# Patient Record
Sex: Female | Born: 1980 | Race: Black or African American | Marital: Single | Smoking: Current every day smoker
Health system: Southern US, Community
[De-identification: ages and names within clinical notes are randomized; demographics above are authoritative.]

## PROBLEM LIST (undated history)

## (undated) DIAGNOSIS — F419 Anxiety disorder, unspecified: Secondary | ICD-10-CM

## (undated) HISTORY — PX: TUBAL LIGATION: SHX77

---

## 2012-09-09 ENCOUNTER — Encounter (HOSPITAL_COMMUNITY): Payer: Self-pay | Admitting: *Deleted

## 2012-09-09 ENCOUNTER — Emergency Department (HOSPITAL_COMMUNITY)
Admission: EM | Admit: 2012-09-09 | Discharge: 2012-09-09 | Disposition: A | Discharge status: Home / Self Care | Payer: Medicaid Other | Attending: Emergency Medicine | Admitting: Emergency Medicine

## 2012-09-09 ENCOUNTER — Emergency Department (HOSPITAL_COMMUNITY): Payer: Medicaid Other

## 2012-09-09 DIAGNOSIS — F17200 Nicotine dependence, unspecified, uncomplicated: Secondary | ICD-10-CM | POA: Insufficient documentation

## 2012-09-09 DIAGNOSIS — R209 Unspecified disturbances of skin sensation: Secondary | ICD-10-CM | POA: Insufficient documentation

## 2012-09-09 DIAGNOSIS — R079 Chest pain, unspecified: Secondary | ICD-10-CM | POA: Insufficient documentation

## 2012-09-09 DIAGNOSIS — M5412 Radiculopathy, cervical region: Secondary | ICD-10-CM | POA: Insufficient documentation

## 2012-09-09 DIAGNOSIS — M79601 Pain in right arm: Secondary | ICD-10-CM

## 2012-09-09 DIAGNOSIS — M79609 Pain in unspecified limb: Secondary | ICD-10-CM | POA: Insufficient documentation

## 2012-09-09 MED ORDER — HYDROCODONE-ACETAMINOPHEN 5-325 MG PO TABS
1.0000 | ORAL_TABLET | Freq: Four times a day (QID) | ORAL | Status: DC | PRN
Start: 2012-09-09 — End: 2013-08-19

## 2012-09-09 MED ORDER — METHYLPREDNISOLONE 4 MG PO KIT: PACK | ORAL | Status: DC

## 2012-09-09 MED ORDER — HYDROMORPHONE HCL PF 1 MG/ML IJ SOLN
1.0000 mg | Freq: Once | INTRAMUSCULAR | Status: AC
  Administered 2012-09-09: 1 mg via INTRAMUSCULAR
  Filled 2012-09-09: qty 1

## 2012-09-09 MED ORDER — IBUPROFEN 600 MG PO TABS: 600.0000 mg | ORAL_TABLET | Freq: Four times a day (QID) | ORAL | Status: DC | PRN

## 2012-09-09 NOTE — ED Notes (Signed)
Pt reports right arm pain and numbness since Friday morning and pain with movement and chest pain

## 2012-09-09 NOTE — ED Provider Notes (Signed)
History     CSN: [id number]  Arrival date & time 09/09/12  1718   First MD Initiated Contact with Patient 09/09/12 2120      Chief Complaint  Patient presents with  . Arm Pain    right  . Chest Pain    (Consider location/radiation/quality/duration/timing/severity/associated sxs/prior treatment) HPI Comments: 32 y Holly [name] with R arm pain since Saturday AM.  She reports that she woke up with the pain and that it has gotten worse since onset.  She describes it as a shooting pain, beginning in her neck and radiating down her arm.  She endorses paresthesias, as well.  No falls, HA, vision changes, slurred speech, CP, SOB.  No trauma, MVC, falls, roller coasters, etc.    Patient is a 32 y.o. female presenting with extremity pain. The history is provided by the patient.  Extremity Pain This is a new problem. Episode onset: saturday morning. The problem occurs constantly. The problem has been gradually worsening. Pertinent negatives include no abdominal pain, change in bowel habit, chest pain, coughing, fever, headaches, nausea, urinary symptoms, visual change or weakness. She has tried acetaminophen for the symptoms. The treatment provided mild relief.    History reviewed. No pertinent past medical history.  History reviewed. No pertinent past surgical history.  No family history on file.  History  Substance Use Topics  . Smoking status: Current Every Day Smoker  . Smokeless tobacco: Not on file  . Alcohol Use: No    OB History   Grav Para Term Preterm Abortions TAB SAB Ect Mult Living                  Review of Systems  Constitutional: Negative for fever.  Respiratory: Negative for cough.   Cardiovascular: Negative for chest pain.  Gastrointestinal: Negative for nausea, abdominal pain and change in bowel habit.  Neurological: Negative for weakness and headaches.  All other systems reviewed and are negative.    Allergies  Review of patient's allergies indicates no known  allergies.  Home Medications   Current Outpatient Rx  Name  Route  Sig  Dispense  Refill  . ibuprofen (ADVIL,MOTRIN) 200 MG tablet   Oral   Take 200 mg by mouth every 6 (six) hours as needed for pain.         Marland Kitchen HYDROcodone-acetaminophen (NORCO) 5-325 MG per tablet   Oral   Take 1 tablet by mouth every 6 (six) hours as needed for pain.   12 tablet   0   . ibuprofen (ADVIL,MOTRIN) 600 MG tablet   Oral   Take 1 tablet (600 mg total) by mouth every 6 (six) hours as needed for pain.   30 tablet   0   . methylPREDNISolone (MEDROL DOSEPAK) 4 MG tablet      Take 24 mg (6 tabs) on day 1, then 20 mg (5 tabs) on day 2, then 16 mg (4 tabs) on day 3, then 12 mg (3 tabs) on day 4, then 8 mg (2 tabs) on day 5 and then 4 mg (1 tab) on day 6   21 tablet   0     BP 135/94  Pulse Holly  Temp(Src) 99 Holly [name] (37.2 C) (Oral)  Resp 16  SpO2 100%  Physical Exam  Vitals reviewed. Constitutional: She is oriented to person, place, and time. She appears well-developed and well-nourished.  HENT:  Right Ear: External ear normal.  Left Ear: External ear normal.  Mouth/Throat: No oropharyngeal exudate.  Eyes: Conjunctivae  and EOM are normal. Pupils are equal, round, and reactive to light.  Neck: Normal range of motion. Neck supple.  Cardiovascular: Normal rate, regular rhythm, normal heart sounds and intact distal pulses.  Exam reveals no gallop and no friction rub.   No murmur heard. Pulmonary/Chest: Effort normal and breath sounds normal.  Abdominal: Soft. Bowel sounds are normal. She exhibits no distension. There is no tenderness.  Musculoskeletal: She exhibits no edema.       Right shoulder: She exhibits decreased range of motion (2/2 pain). She exhibits no pain, no spasm and normal strength.       Right elbow: She exhibits decreased range of motion (2/2 pain). She exhibits no swelling and no effusion.       Right wrist: Normal.       Back:       Right hand: Normal. Normal sensation noted.  Decreased sensation is not present in the ulnar distribution, is not present in the medial distribution and is not present in the radial distribution. Normal strength noted.  R arm exam: Pain with all movements 5/5 abduction/ext rotation of R shoudler 5/5 flex/ext of R elbow 5/5 R wrist flex/ext 5/5 grip Sensation intact to light touch in median, axillary, radial and ulnar distributions 2+ reflexes, 2+ radial  Neurological: She is alert and oriented to person, place, and time. No cranial nerve deficit.  Skin: Skin is warm and dry. No rash noted.  Psychiatric: She has a normal mood and affect.    ED Course  Procedures (including critical care time)  Labs Reviewed - No data to display Dg Cervical Spine Complete  09/09/2012  *RADIOLOGY REPORT*  Clinical Data: Right arm pain and numbness for 3 days without trauma  CERVICAL SPINE - COMPLETE 4+ VIEW  Comparison: None.  Findings: The lateral view images through the bottom of C7. Prevertebral soft tissues are within normal limits.  Maintenance of vertebral body height.  Mild straightening expected cervical lordosis.  Possible mild left greater than right neural foraminal narrowing secondary uncovertebral joint hypertrophy at C5-C6. Lateral masses and imaged portion of odontoid process intact.  IMPRESSION: Mild nonspecific straightening of expected cervical lordosis. Possible mild left greater than right neural foraminal narrowing at C5-C6 secondary uncovertebral joint hypertrophy.   Original Report Authenticated By: Jeronimo Greaves, M.D.      1. Cervical radiculopathy   2. Right arm pain       MDM   32 y Holly [name] with R arm pain since Saturday AM.  She reports that she woke up with the pain and that it has gotten worse since onset.  She describes it as a shooting pain, beginning in her neck and radiating down her arm.  She endorses paresthesias, as well.  No falls, HA, vision changes, slurred speech, CP, SOB.  No trauma, MVC, falls, roller coasters, etc.   AFVSS, well appearing.  5/5 strength of all joints of RUE with prompting.  Gross sensation intact.  Normal reflexes.  TTP of paracervical muscles.  Likely MSK/cervical radiculopathy.  Pain does not appear be primarily related to the shoulder.  C-spine xrays and IM dilaudid.  11:10 PM Pain well controlled.  Pt is agreeable with discharge.  Exam c/w cervical radiculopathy.  Medrol dose pak, Ibuprofen, short course of Norco for breakthrough.  Return precautions reviewed.  It is felt the pt is stable for d/c with close PCP Holly [name]/u.  All questions answered and patient expressed understanding.  Disposition: Discharge  Condition: Good  Follow-up Information   Follow  up with your regular doctor as discussed..        Medication List    TAKE these medications       HYDROcodone-acetaminophen 5-325 MG per tablet  Commonly known as:  NORCO  Take 1 tablet by mouth every 6 (six) hours as needed for pain.     ibuprofen 600 MG tablet  Commonly known as:  ADVIL,MOTRIN  Take 1 tablet (600 mg total) by mouth every 6 (six) hours as needed for pain.     methylPREDNISolone 4 MG tablet  Commonly known as:  MEDROL DOSEPAK  Take 24 mg (6 tabs) on day 1, then 20 mg (5 tabs) on day 2, then 16 mg (4 tabs) on day 3, then 12 mg (3 tabs) on day 4, then 8 mg (2 tabs) on day 5 and then 4 mg (1 tab) on day 6       Pt seen in conjunction with my attending, Dr. Anitra Lauth.Oleh Genin, MD PGY-II [facility] Emergency Medicine Resident      Oleh Genin, MD 09/10/12 [phone]

## 2012-09-12 NOTE — ED Provider Notes (Signed)
I saw and evaluated the patient, reviewed the resident's note and I agree with the findings and plan. Pt with sx most suggestive of cervical radiculopathy with pain in the right cervical region and down the arm.  Normal strength.  Gwyneth Sprout, MD 09/12/12 2256

## 2012-11-18 ENCOUNTER — Ambulatory Visit: Payer: Self-pay

## 2012-12-04 ENCOUNTER — Ambulatory Visit: Payer: Self-pay | Admitting: Family Medicine

## 2013-08-14 ENCOUNTER — Other Ambulatory Visit: Payer: Self-pay | Admitting: Neurosurgery

## 2013-08-19 ENCOUNTER — Encounter (HOSPITAL_COMMUNITY): Payer: Self-pay | Admitting: Pharmacy Technician

## 2013-08-26 NOTE — Pre-Procedure Instructions (Signed)
Twana FirstSharrica Callicott  08/26/2013   Your procedure is scheduled on:  Monday, April 13th  Report to Admitting at 0800 AM.  Call this number if you have problems the morning of surgery: [phone]   Remember:   Do not eat food or drink liquids after midnight.   Take these medicines the morning of surgery with A SIP OF WATER: none   Do not wear jewelry, make-up or nail polish.  Do not wear lotions, powders, or perfumes. You may wear deodorant.  Do not shave 48 hours prior to surgery. Men may shave face and neck.  Do not bring valuables to the hospital.  [facility]  for any belongings or valuables.               Contacts, dentures or bridgework may not be worn into surgery.  Leave suitcase in the car. After surgery it may be brought to your room.  For patients admitted to the hospital, discharge time is determined by your  treatment team.               Patients discharged the day of surgery will not be allowed to drive home.  Please read over the following fact sheets that you were given: Pain Booklet, Coughing and Deep Breathing, MRSA Information and Surgical Site Infection Prevention [facility] - Preparing for Surgery  Before surgery, you can play an important role.  Because skin is not sterile, your skin needs to be as free of germs as possible.  You can reduce the number of germs on you skin by washing with CHG (chlorahexidine gluconate) soap before surgery.  CHG is an antiseptic cleaner which kills germs and bonds with the skin to continue killing germs even after washing.  Please DO NOT use if you have an allergy to CHG or antibacterial soaps.  If your skin becomes reddened/irritated stop using the CHG and inform your nurse when you arrive at Short Stay.  Do not shave (including legs and underarms) for at least 48 hours prior to the first CHG shower.  You may shave your face.  Please follow these instructions carefully:   1.  Shower with CHG Soap the night before  surgery and the morning of Surgery.  2.  If you choose to wash your hair, wash your hair first as usual with your normal shampoo.  3.  After you shampoo, rinse your hair and body thoroughly to remove the shampoo.  4.  Use CHG as you would any other liquid soap.  You can apply CHG directly to the skin and wash gently with scrungie or a clean washcloth.  5.  Apply the CHG Soap to your body ONLY FROM THE NECK DOWN.  Do not use on open wounds or open sores.  Avoid contact with your eyes, ears, mouth and genitals (private parts).  Wash genitals (private parts) with your normal soap.  6.  Wash thoroughly, paying special attention to the area where your surgery will be performed.  7.  Thoroughly rinse your body with warm water from the neck down.  8.  DO NOT shower/wash with your normal soap after using and rinsing off the CHG Soap.  9.  Pat yourself dry with a clean towel.            10.  Wear clean pajamas.            11.  Place clean sheets on your bed the night of your first shower and do not  sleep with pets.  Day of Surgery  Do not apply any lotions/deoderants the morning of surgery.  Please wear clean clothes to the hospital/surgery center.

## 2013-08-27 ENCOUNTER — Inpatient Hospital Stay (HOSPITAL_COMMUNITY)
Admission: RE | Admit: 2013-08-27 | Discharge: 2013-08-27 | Disposition: A | Discharge status: Home / Self Care | Payer: Medicaid Other | Source: Ambulatory Visit

## 2013-08-29 NOTE — Progress Notes (Signed)
Several unsuccessful attempts were made to contact pt. LVM with pre-op instructions according to pre-op check list on pt voicemail # [phone]. Pt also instructed to stop taking Aspirin, multivitamins and herbal medications. Do not take any NSAIDs ie: Ibuprofen, Advil, Naproxen or any medication containing Aspirin.

## 2013-08-31 MED ORDER — CEFAZOLIN SODIUM-DEXTROSE 2-3 GM-% IV SOLR: 2.0000 g | INTRAVENOUS | Status: DC

## 2013-08-31 MED ORDER — DEXAMETHASONE SODIUM PHOSPHATE 10 MG/ML IJ SOLN: 10.0000 mg | INTRAMUSCULAR | Status: DC

## 2013-09-01 ENCOUNTER — Encounter (HOSPITAL_COMMUNITY): Payer: Self-pay | Admitting: Anesthesiology

## 2013-09-01 ENCOUNTER — Encounter (HOSPITAL_COMMUNITY): Admission: RE | Payer: Self-pay | Source: Ambulatory Visit

## 2013-09-01 ENCOUNTER — Encounter (HOSPITAL_COMMUNITY): Payer: Medicaid Other | Admitting: Anesthesiology

## 2013-09-01 ENCOUNTER — Ambulatory Visit (HOSPITAL_COMMUNITY): Admission: RE | Admit: 2013-09-01 | Payer: Medicaid Other | Source: Ambulatory Visit | Admitting: Neurosurgery

## 2013-09-01 SURGERY — POSTERIOR CERVICAL LAMINECTOMY
Anesthesia: General | Laterality: Right

## 2013-09-01 MED ORDER — GLYCOPYRROLATE 0.2 MG/ML IJ SOLN
INTRAMUSCULAR | Status: AC
  Filled 2013-09-01: qty 5

## 2013-09-01 MED ORDER — ROCURONIUM BROMIDE 50 MG/5ML IV SOLN
INTRAVENOUS | Status: AC
  Filled 2013-09-01: qty 1

## 2013-09-01 MED ORDER — EPHEDRINE SULFATE 50 MG/ML IJ SOLN
INTRAMUSCULAR | Status: AC
  Filled 2013-09-01: qty 1

## 2013-09-01 MED ORDER — PROPOFOL 10 MG/ML IV BOLUS
INTRAVENOUS | Status: AC
  Filled 2013-09-01: qty 20

## 2013-09-01 MED ORDER — NEOSTIGMINE METHYLSULFATE 1 MG/ML IJ SOLN
INTRAMUSCULAR | Status: AC
  Filled 2013-09-01: qty 10

## 2013-09-01 MED ORDER — ONDANSETRON HCL 4 MG/2ML IJ SOLN
INTRAMUSCULAR | Status: AC
  Filled 2013-09-01: qty 2

## 2013-09-01 MED ORDER — MIDAZOLAM HCL 2 MG/2ML IJ SOLN
INTRAMUSCULAR | Status: AC
  Filled 2013-09-01: qty 2

## 2013-09-01 MED ORDER — SODIUM CHLORIDE 0.9 % IJ SOLN
INTRAMUSCULAR | Status: AC
  Filled 2013-09-01: qty 10

## 2013-09-01 MED ORDER — FENTANYL CITRATE 0.05 MG/ML IJ SOLN
INTRAMUSCULAR | Status: AC
  Filled 2013-09-01: qty 5

## 2016-11-26 ENCOUNTER — Encounter: Payer: Self-pay | Admitting: Medical Oncology

## 2016-11-26 ENCOUNTER — Emergency Department
Admission: EM | Admit: 2016-11-26 | Discharge: 2016-11-26 | Disposition: A | Discharge status: Home / Self Care | Payer: Medicaid Other | Attending: Emergency Medicine | Admitting: Emergency Medicine

## 2016-11-26 DIAGNOSIS — M5431 Sciatica, right side: Secondary | ICD-10-CM | POA: Diagnosis not present

## 2016-11-26 DIAGNOSIS — M545 Low back pain: Secondary | ICD-10-CM | POA: Diagnosis present

## 2016-11-26 DIAGNOSIS — F17200 Nicotine dependence, unspecified, uncomplicated: Secondary | ICD-10-CM | POA: Insufficient documentation

## 2016-11-26 MED ORDER — PREDNISONE 10 MG PO TABS: ORAL_TABLET | ORAL | 0 refills | Status: DC

## 2016-11-26 MED ORDER — METHYLPREDNISOLONE SODIUM SUCC 125 MG IJ SOLR
125.0000 mg | Freq: Once | INTRAMUSCULAR | Status: AC
  Administered 2016-11-26: 125 mg via INTRAMUSCULAR
  Filled 2016-11-26: qty 2

## 2016-11-26 MED ORDER — KETOROLAC TROMETHAMINE 60 MG/2ML IM SOLN
30.0000 mg | Freq: Once | INTRAMUSCULAR | Status: AC
  Administered 2016-11-26: 30 mg via INTRAMUSCULAR
  Filled 2016-11-26: qty 2

## 2016-11-26 NOTE — ED Triage Notes (Signed)
Pt reports rt buttock pain that radiates into her leg, been going on for a "while".

## 2016-11-26 NOTE — ED Notes (Signed)
10/10 pain down right leg with movement. +2 dp pulse. Full rom with pain. Denies injury. 2 day duration.

## 2016-11-26 NOTE — ED Provider Notes (Signed)
[facility] Emergency Department Provider Note  ____________________________________________  Time seen: Approximately 2:27 PM  I have reviewed the triage vital signs and the nursing notes.   HISTORY  Chief Complaint Back Pain    HPI Holly [name] is a 36 y.o. female that presents to the emergency department withlower right-sided back and buttocks pain that radiates down right leg for one week. Certain movements make pain shoot down the back of her right leg. Her leg felt a little numb yesterday. She has a history of a pinched nerve in her neck and states that this feels the same. She has been walking normally. No bowel or bladder dysfunction or saddle paresthesias. She denies headache, shortness breath, chest pain, nausea, vomiting, abdominal pain, weakness, dysuria.   History reviewed. No pertinent past medical history.  There are no active problems to display for this patient.   History reviewed. No pertinent surgical history.  Prior to Admission medications   Medication Sig Start Date End Date Taking? Authorizing Provider  ibuprofen (ADVIL,MOTRIN) 200 MG tablet Take 200 mg by mouth every 6 (six) hours as needed for pain.    [provider]  predniSONE (DELTASONE) 10 MG tablet Take 6 tablets on day 1, take 5 tablets on day 2, take 4 tablets on day 3, take 3 tablets on day 4, take 2 tablets on day 5, take 1 tablet on day 6 11/26/16   Enid Derry, PA-C    Allergies Patient has no known allergies.  No family history on file.  Social History Social History  Substance Use Topics  . Smoking status: Current Every Day Smoker  . Smokeless tobacco: Not on file  . Alcohol use No     Review of Systems  Constitutional: No fever/chills Cardiovascular: No chest pain. Respiratory: No SOB. Gastrointestinal: No abdominal pain.  No nausea, no vomiting.  Genitourinary: Negative for dysuria. Musculoskeletal: Positive for back pain. Skin:  Negative for rash, abrasions, lacerations, ecchymosis. Neurological: Negative for headaches   ____________________________________________   PHYSICAL EXAM:  VITAL SIGNS: ED Triage Vitals  Enc Vitals Group     BP --      Pulse --      Resp --      Temp --      Temp src --      SpO2 --      Weight 11/26/16 1347 140 lb (63.5 kg)     Height 11/26/16 1347 5\' 9"  (1.753 m)     Head Circumference --      Peak Flow --      Pain Score 11/26/16 1346 10     Pain Loc --      Pain Edu? --      Excl. in GC? --      Constitutional: Alert and oriented. Well appearing and in no acute distress. Eyes: Conjunctivae are normal. PERRL. EOMI. Head: Atraumatic. ENT:      Ears:      Nose: No congestion/rhinnorhea.      Mouth/Throat: Mucous membranes are moist.  Neck: No stridor.  Cardiovascular: Normal rate, regular rhythm.  Good peripheral circulation. Respiratory: Normal respiratory effort without tachypnea or retractions. Lungs CTAB. Good air entry to the bases with no decreased or absent breath sounds.  Gastrointestinal: Bowel sounds 4 quadrants. Soft and nontender to palpation. No guarding or rigidity. No palpable masses. No distention. Musculoskeletal: Full range of motion to all extremities. No gross deformities appreciated. Tenderness to palpation over SI joint. Positive straight leg raise. Negative  cross leg raise.  Neurologic:  Normal speech and language. No gross focal neurologic deficits are appreciated.  Skin:  Skin is warm, dry and intact. No rash noted. Psychiatric: Mood and affect are normal. Speech and behavior are normal. Patient exhibits appropriate insight and judgement.   ____________________________________________   LABS (all labs ordered are listed, but only abnormal results are displayed)  Labs Reviewed - No data to display ____________________________________________  EKG   ____________________________________________  RADIOLOGY   No results  found.  ____________________________________________    PROCEDURES  Procedure(s) performed:    Procedures    Medications  methylPREDNISolone sodium succinate (SOLU-MEDROL) 125 mg/2 mL injection 125 mg (125 mg Intramuscular Given 11/26/16 1427)  ketorolac (TORADOL) injection 30 mg (30 mg Intramuscular Given 11/26/16 1427)     ____________________________________________   INITIAL IMPRESSION / ASSESSMENT AND PLAN / ED COURSE  Pertinent labs & imaging results that were available during my care of the patient were reviewed by me and considered in my medical decision making (see chart for details).  Review of the  CSRS was performed in accordance of the [facility] prior to dispensing any controlled drugs.   Patient's diagnosis is consistent with sciatica. Vital signs and exam are reassuring. No bowel or bladder dysfunction. Patient was given Solu-Medrol and Toradol in ED. Patient will be discharged home with prescriptions for prednisone. Patient is to follow up with PCP as directed. Patient is given ED precautions to return to the ED for any worsening or new symptoms.   ____________________________________________  FINAL CLINICAL IMPRESSION(S) / ED DIAGNOSES  Final diagnoses:  Sciatica of right side      NEW MEDICATIONS STARTED DURING THIS VISIT:  Discharge Medication List as of 11/26/2016  2:52 PM    START taking these medications   Details  predniSONE (DELTASONE) 10 MG tablet Take 6 tablets on day 1, take 5 tablets on day 2, take 4 tablets on day 3, take 3 tablets on day 4, take 2 tablets on day 5, take 1 tablet on day 6, Print            This chart was dictated using voice recognition software/Dragon. Despite best efforts to proofread, errors can occur which can change the meaning. Any change was purely unintentional.    Enid DerryWagner, Keni Wafer, PA-C 11/26/16 1515    Phineas SemenGoodman, Gray[place], MD 11/27/16 [phone]

## 2017-02-02 ENCOUNTER — Emergency Department
Admission: EM | Admit: 2017-02-02 | Discharge: 2017-02-02 | Disposition: A | Discharge status: Home / Self Care | Payer: No Typology Code available for payment source | Attending: Emergency Medicine | Admitting: Emergency Medicine

## 2017-02-02 ENCOUNTER — Encounter: Payer: Self-pay | Admitting: Emergency Medicine

## 2017-02-02 DIAGNOSIS — Z5321 Procedure and treatment not carried out due to patient leaving prior to being seen by health care provider: Secondary | ICD-10-CM | POA: Diagnosis not present

## 2017-02-02 DIAGNOSIS — R1084 Generalized abdominal pain: Secondary | ICD-10-CM | POA: Diagnosis present

## 2017-02-02 LAB — COMPREHENSIVE METABOLIC PANEL
ALBUMIN: 4.2 g/dL (ref 3.5–5.0)
ALK PHOS: 44 U/L (ref 38–126)
ALT: 16 U/L (ref 14–54)
ANION GAP: 10 (ref 5–15)
AST: 29 U/L (ref 15–41)
BILIRUBIN TOTAL: 0.9 mg/dL (ref 0.3–1.2)
BUN: 12 mg/dL (ref 6–20)
CALCIUM: 9.5 mg/dL (ref 8.9–10.3)
CO2: 24 mmol/L (ref 22–32)
CREATININE: 0.73 mg/dL (ref 0.44–1.00)
Chloride: 103 mmol/L (ref 101–111)
GFR calc Af Amer: >60 mL/min (ref >60)
GFR calc non Af Amer: >60 mL/min (ref >60)
Glucose, Bld: 93 mg/dL (ref 65–99)
Potassium: 3.1 mmol/L — ABNORMAL LOW (ref 3.5–5.1)
Sodium: 137 mmol/L (ref 135–145)
TOTAL PROTEIN: 8.1 g/dL (ref 6.5–8.1)

## 2017-02-02 LAB — URINALYSIS, COMPLETE (UACMP) WITH MICROSCOPIC
Bilirubin Urine: NEGATIVE
GLUCOSE, UA: NEGATIVE mg/dL
Ketones, ur: NEGATIVE mg/dL
NITRITE: NEGATIVE
PROTEIN: NEGATIVE mg/dL
SPECIFIC GRAVITY, URINE: 1.016 (ref 1.005–1.030)
pH: 5 (ref 5.0–8.0)

## 2017-02-02 LAB — CBC
HCT: 37.3 % (ref 35.0–47.0)
Hemoglobin: 12.9 g/dL (ref 12.0–16.0)
MCH: 31.1 pg (ref 26.0–34.0)
MCHC: 34.5 g/dL (ref 32.0–36.0)
MCV: 89.9 fL (ref 80.0–100.0)
PLATELETS: 141 10*3/uL — AB (ref 150–440)
RBC: 4.15 MIL/uL (ref 3.80–5.20)
RDW: 13.6 % (ref 11.5–14.5)
WBC: 3.8 10*3/uL (ref 3.6–11.0)

## 2017-02-02 LAB — LIPASE, BLOOD: Lipase: 20 U/L (ref 11–51)

## 2017-02-02 LAB — POCT PREGNANCY, URINE: Preg Test, Ur: NEGATIVE

## 2017-02-02 NOTE — ED Notes (Signed)
Pt reports she no longer wants to wait, pt advised that she only had one person in front of her based off of time and that as long as no critical patients came in she would be bedded soon, pt declined to stay.
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
## 2017-02-02 NOTE — ED Notes (Signed)
Pt would like work note to show came to ED since left work

## 2017-02-05 ENCOUNTER — Telehealth: Payer: Self-pay | Admitting: Emergency Medicine
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
## 2019-10-22 ENCOUNTER — Other Ambulatory Visit: Payer: Self-pay | Admitting: Neurosurgery

## 2019-11-25 NOTE — Progress Notes (Signed)
[facility] [address], [place] - [address] [place] [place] [address] Phone: [phone] Fax: [phone]      Your procedure is scheduled on Monday, July 12th.  Report to [facility] Main Entrance "A" at 6:00 A.M., and check in at the Admitting office.  Call this number if you have problems the morning of surgery:  [phone]  Call [phone] if you have any questions prior to your surgery date Monday-Friday 8am-4pm    Remember:  Do not eat or drink after midnight the night before your surgery     Take these medicines the morning of surgery with A SIP OF WATER   Tylenol - if needed  As of today, STOP taking any Aspirin (unless otherwise instructed by your surgeon) Aleve, Naproxen, Ibuprofen, Motrin, Advil, Goody's, BC's, all herbal medications, fish oil, and all vitamins.                      Do not wear jewelry, make up, or nail polish            Do not wear lotions, powders, perfumes, or deodorant.            Do not shave 48 hours prior to surgery.              Do not bring valuables to the hospital.            [facility] is not responsible for any belongings or valuables.  Do NOT Smoke (Tobacco/Vaping) or drink Alcohol 24 hours prior to your procedure If you use a CPAP at night, you may bring all equipment for your overnight stay.   Contacts, glasses, dentures or bridgework may not be worn into surgery.      For patients admitted to the hospital, discharge time will be determined by your treatment team.   Patients discharged the day of surgery will not be allowed to drive home, and someone needs to stay with them for 24 hours.    Special instructions:   [facility]- Preparing For Surgery  Before surgery, you can play an important role. Because skin is not sterile, your skin needs to be as free of germs as possible. You can reduce the number of germs on your skin by washing with CHG (chlorahexidine gluconate) Soap before surgery.  CHG is an  antiseptic cleaner which kills germs and bonds with the skin to continue killing germs even after washing.    Oral Hygiene is also important to reduce your risk of infection.  Remember - BRUSH YOUR TEETH THE MORNING OF SURGERY WITH YOUR REGULAR TOOTHPASTE  Please do not use if you have an allergy to CHG or antibacterial soaps. If your skin becomes reddened/irritated stop using the CHG.  Do not shave (including legs and underarms) for at least 48 hours prior to first CHG shower. It is OK to shave your face.  Please follow these instructions carefully.   1. Shower the NIGHT BEFORE SURGERY and the MORNING OF SURGERY with CHG Soap.   2. If you chose to wash your hair, wash your hair first as usual with your normal shampoo.  3. After you shampoo, rinse your hair and body thoroughly to remove the shampoo.  4. Use CHG as you would any other liquid soap. You can apply CHG directly to the skin and wash gently with a scrungie or a clean washcloth.   5. Apply the CHG Soap to your body ONLY FROM THE  NECK DOWN.  Do not use on open wounds or open sores. Avoid contact with your eyes, ears, mouth and genitals (private parts). Wash Face and genitals (private parts)  with your normal soap.   6. Wash thoroughly, paying special attention to the area where your surgery will be performed.  7. Thoroughly rinse your body with warm water from the neck down.  8. DO NOT shower/wash with your normal soap after using and rinsing off the CHG Soap.  9. Pat yourself dry with a CLEAN TOWEL.  10. Wear CLEAN PAJAMAS to bed the night before surgery  11. Place CLEAN SHEETS on your bed the night of your first shower and DO NOT SLEEP WITH PETS.   Day of Surgery: Wear Clean/Comfortable clothing the morning of surgery Do not apply any deodorants/lotions.   Remember to brush your teeth WITH YOUR REGULAR TOOTHPASTE.   Please read over the following fact sheets that you were given.

## 2019-11-26 ENCOUNTER — Encounter (HOSPITAL_COMMUNITY): Payer: Self-pay

## 2019-11-26 ENCOUNTER — Encounter (HOSPITAL_COMMUNITY)
Admission: RE | Admit: 2019-11-26 | Discharge: 2019-11-26 | Disposition: A | Discharge status: Home / Self Care | Payer: 59 | Source: Ambulatory Visit | Attending: Neurosurgery | Admitting: Neurosurgery

## 2019-11-26 ENCOUNTER — Other Ambulatory Visit: Payer: Self-pay

## 2019-11-26 DIAGNOSIS — Z01812 Encounter for preprocedural laboratory examination: Secondary | ICD-10-CM | POA: Insufficient documentation

## 2019-11-26 HISTORY — DX: Anxiety disorder, unspecified: F41.9

## 2019-11-26 LAB — SURGICAL PCR SCREEN
MRSA, PCR: NEGATIVE
Staphylococcus aureus: NEGATIVE

## 2019-11-26 LAB — CBC WITH DIFFERENTIAL/PLATELET
Abs Immature Granulocytes: 0.02 10*3/uL (ref 0.00–0.07)
Basophils Absolute: 0 10*3/uL (ref 0.0–0.1)
Basophils Relative: 0 %
Eosinophils Absolute: 0 10*3/uL (ref 0.0–0.5)
Eosinophils Relative: 1 %
HCT: 39.8 % (ref 36.0–46.0)
Hemoglobin: 12.9 g/dL (ref 12.0–15.0)
Immature Granulocytes: 1 %
Lymphocytes Relative: 37 %
Lymphs Abs: 1.4 10*3/uL (ref 0.7–4.0)
MCH: 30.6 pg (ref 26.0–34.0)
MCHC: 32.4 g/dL (ref 30.0–36.0)
MCV: 94.5 fL (ref 80.0–100.0)
Monocytes Absolute: 0.4 10*3/uL (ref 0.1–1.0)
Monocytes Relative: 11 %
Neutro Abs: 1.9 10*3/uL (ref 1.7–7.7)
Neutrophils Relative %: 50 %
Platelets: 196 10*3/uL (ref 150–400)
RBC: 4.21 MIL/uL (ref 3.87–5.11)
RDW: 13.2 % (ref 11.5–15.5)
WBC: 3.8 10*3/uL — ABNORMAL LOW (ref 4.0–10.5)
nRBC: 0 % (ref 0.0–0.2)

## 2019-11-26 LAB — BASIC METABOLIC PANEL
Anion gap: 9 (ref 5–15)
BUN: 8 mg/dL (ref 6–20)
CO2: 25 mmol/L (ref 22–32)
Calcium: 9.3 mg/dL (ref 8.9–10.3)
Chloride: 104 mmol/L (ref 98–111)
Creatinine, Ser: 0.65 mg/dL (ref 0.44–1.00)
GFR calc Af Amer: >60 mL/min (ref >60)
GFR calc non Af Amer: >60 mL/min (ref >60)
Glucose, Bld: 89 mg/dL (ref 70–99)
Potassium: 3.9 mmol/L (ref 3.5–5.1)
Sodium: 138 mmol/L (ref 135–145)

## 2019-11-26 LAB — TYPE AND SCREEN
ABO/RH(D): B POS
Antibody Screen: NEGATIVE

## 2019-11-26 NOTE — Progress Notes (Signed)
PCP:  [facility] Cardiologist:  Denies  EKG:  N/A CXR:  N/A ECHO:  Denies Stress Test:  Denies Cardiac Cath:  Denies  Covid test 7/0/21  Patient denies shortness of breath, fever, cough, and chest pain at PAT appointment.  Patient verbalized understanding of instructions provided today at the PAT appointment.  Patient asked to review instructions at home and day of surgery.

## 2019-11-28 ENCOUNTER — Other Ambulatory Visit
Admission: RE | Admit: 2019-11-28 | Discharge: 2019-11-28 | Disposition: A | Discharge status: Home / Self Care | Payer: Medicaid Other | Source: Ambulatory Visit | Attending: Orthopedic Surgery | Admitting: Orthopedic Surgery

## 2019-11-28 ENCOUNTER — Other Ambulatory Visit: Payer: Self-pay

## 2019-11-28 DIAGNOSIS — Z20822 Contact with and (suspected) exposure to covid-19: Secondary | ICD-10-CM | POA: Diagnosis not present

## 2019-11-28 DIAGNOSIS — Z01812 Encounter for preprocedural laboratory examination: Secondary | ICD-10-CM | POA: Diagnosis not present

## 2019-11-29 LAB — SARS CORONAVIRUS 2 (TAT 6-24 HRS): SARS Coronavirus 2: NEGATIVE

## 2019-11-30 NOTE — Anesthesia Preprocedure Evaluation (Addendum)
Anesthesia Evaluation  Patient identified by MRN, date of birth, ID band Patient awake    Reviewed: Allergy & Precautions, NPO status , Patient's Chart, lab work & pertinent test results  Airway Mallampati: II  TM Distance: >3 FB Neck ROM: Full    Dental no notable dental hx. (+) Dental Advisory Given, Teeth Intact   Pulmonary Current Smoker,    Pulmonary exam normal breath sounds clear to auscultation       Cardiovascular negative cardio ROS Normal cardiovascular exam Rhythm:Regular Rate:Normal     Neuro/Psych PSYCHIATRIC DISORDERS Anxiety negative neurological ROS     GI/Hepatic negative GI ROS, Neg liver ROS,   Endo/Other  negative endocrine ROS  Renal/GU negative Renal ROS     Musculoskeletal negative musculoskeletal ROS (+)   Abdominal   Peds  Hematology negative hematology ROS (+)   Anesthesia Other Findings   Reproductive/Obstetrics                            Anesthesia Physical Anesthesia Plan  ASA: II  Anesthesia Plan: General   Post-op Pain Management:    Induction: Intravenous  PONV Risk Score and Plan: 3 and Ondansetron, Dexamethasone, Treatment may vary due to age or medical condition and Midazolam  Airway Management Planned: Oral ETT  Additional Equipment: None  Intra-op Plan:   Post-operative Plan: Extubation in OR  Informed Consent: I have reviewed the patients History and Physical, chart, labs and discussed the procedure including the risks, benefits and alternatives for the proposed anesthesia with the patient or authorized representative who has indicated his/her understanding and acceptance.     Dental advisory given  Plan Discussed with: CRNA  Anesthesia Plan Comments:        Anesthesia Quick Evaluation

## 2019-12-01 ENCOUNTER — Encounter (HOSPITAL_COMMUNITY)
Admission: RE | Disposition: A | Discharge status: Home / Self Care | Payer: Self-pay | Source: Home / Self Care | Attending: Neurosurgery

## 2019-12-01 ENCOUNTER — Inpatient Hospital Stay (HOSPITAL_COMMUNITY): Payer: Medicaid Other

## 2019-12-01 ENCOUNTER — Inpatient Hospital Stay (HOSPITAL_COMMUNITY): Payer: Medicaid Other | Admitting: Anesthesiology

## 2019-12-01 ENCOUNTER — Encounter (HOSPITAL_COMMUNITY): Payer: Self-pay | Admitting: Neurosurgery

## 2019-12-01 ENCOUNTER — Other Ambulatory Visit: Payer: Self-pay

## 2019-12-01 ENCOUNTER — Inpatient Hospital Stay (HOSPITAL_COMMUNITY)
Admission: RE | Admit: 2019-12-01 | Discharge: 2019-12-02 | DRG: 472 | Disposition: A | Discharge status: Home / Self Care | Payer: Medicaid Other | Attending: Neurosurgery | Admitting: Neurosurgery

## 2019-12-01 DIAGNOSIS — F17210 Nicotine dependence, cigarettes, uncomplicated: Secondary | ICD-10-CM | POA: Diagnosis present

## 2019-12-01 DIAGNOSIS — M4712 Other spondylosis with myelopathy, cervical region: Secondary | ICD-10-CM | POA: Diagnosis present

## 2019-12-01 DIAGNOSIS — M4722 Other spondylosis with radiculopathy, cervical region: Secondary | ICD-10-CM | POA: Diagnosis present

## 2019-12-01 DIAGNOSIS — M4802 Spinal stenosis, cervical region: Secondary | ICD-10-CM | POA: Diagnosis present

## 2019-12-01 DIAGNOSIS — Z419 Encounter for procedure for purposes other than remedying health state, unspecified: Secondary | ICD-10-CM

## 2019-12-01 HISTORY — PX: ANTERIOR CERVICAL DECOMP/DISCECTOMY FUSION: SHX1161

## 2019-12-01 LAB — POCT PREGNANCY, URINE: Preg Test, Ur: NEGATIVE

## 2019-12-01 LAB — ABO/RH: ABO/RH(D): B POS

## 2019-12-01 SURGERY — ANTERIOR CERVICAL DECOMPRESSION/DISCECTOMY FUSION 3 LEVELS
Anesthesia: General

## 2019-12-01 MED ORDER — MENTHOL 3 MG MT LOZG
1.0000 | LOZENGE | OROMUCOSAL | Status: DC | PRN
  Filled 2019-12-01: qty 9

## 2019-12-01 MED ORDER — DEXAMETHASONE SODIUM PHOSPHATE 10 MG/ML IJ SOLN
10.0000 mg | Freq: Once | INTRAMUSCULAR | Status: DC
  Filled 2019-12-01: qty 1

## 2019-12-01 MED ORDER — SODIUM CHLORIDE 0.9% FLUSH
3.0000 mL | Freq: Two times a day (BID) | INTRAVENOUS | Status: DC
  Administered 2019-12-01: 3 mL via INTRAVENOUS

## 2019-12-01 MED ORDER — LIDOCAINE 2% (20 MG/ML) 5 ML SYRINGE
INTRAMUSCULAR | Status: DC | PRN
  Administered 2019-12-01: 100 mg via INTRAVENOUS

## 2019-12-01 MED ORDER — HYDROMORPHONE HCL 1 MG/ML IJ SOLN
INTRAMUSCULAR | Status: AC
  Filled 2019-12-01: qty 1

## 2019-12-01 MED ORDER — ACETAMINOPHEN 325 MG PO TABS: 650.0000 mg | ORAL_TABLET | ORAL | Status: DC | PRN

## 2019-12-01 MED ORDER — LACTATED RINGERS IV SOLN: INTRAVENOUS | Status: DC

## 2019-12-01 MED ORDER — PROPOFOL 10 MG/ML IV BOLUS
INTRAVENOUS | Status: AC
  Filled 2019-12-01: qty 20

## 2019-12-01 MED ORDER — CEFAZOLIN SODIUM-DEXTROSE 1-4 GM/50ML-% IV SOLN
1.0000 g | Freq: Three times a day (TID) | INTRAVENOUS | Status: AC
  Administered 2019-12-01 (×2): 1 g via INTRAVENOUS
  Filled 2019-12-01 (×2): qty 50

## 2019-12-01 MED ORDER — HYDROCODONE-ACETAMINOPHEN 5-325 MG PO TABS
ORAL_TABLET | ORAL | Status: AC
  Filled 2019-12-01: qty 1

## 2019-12-01 MED ORDER — THROMBIN 5000 UNITS EX SOLR
OROMUCOSAL | Status: DC | PRN
  Administered 2019-12-01: 5 mL via TOPICAL

## 2019-12-01 MED ORDER — LACTATED RINGERS IV SOLN: INTRAVENOUS | Status: DC | PRN

## 2019-12-01 MED ORDER — FENTANYL CITRATE (PF) 250 MCG/5ML IJ SOLN
INTRAMUSCULAR | Status: AC
  Filled 2019-12-01: qty 5

## 2019-12-01 MED ORDER — CHLORHEXIDINE GLUCONATE 0.12 % MT SOLN
15.0000 mL | Freq: Once | OROMUCOSAL | Status: AC
  Administered 2019-12-01: 15 mL via OROMUCOSAL
  Filled 2019-12-01: qty 15

## 2019-12-01 MED ORDER — CYCLOBENZAPRINE HCL 10 MG PO TABS
10.0000 mg | ORAL_TABLET | Freq: Three times a day (TID) | ORAL | Status: DC | PRN
  Administered 2019-12-01 – 2019-12-02 (×3): 10 mg via ORAL
  Filled 2019-12-01 (×2): qty 1

## 2019-12-01 MED ORDER — SODIUM CHLORIDE 0.9% FLUSH: 3.0000 mL | INTRAVENOUS | Status: DC | PRN

## 2019-12-01 MED ORDER — ONDANSETRON HCL 4 MG/2ML IJ SOLN
INTRAMUSCULAR | Status: DC | PRN
  Administered 2019-12-01: 4 mg via INTRAVENOUS

## 2019-12-01 MED ORDER — MIDAZOLAM HCL 5 MG/5ML IJ SOLN
INTRAMUSCULAR | Status: DC | PRN
  Administered 2019-12-01: 2 mg via INTRAVENOUS

## 2019-12-01 MED ORDER — LABETALOL HCL 5 MG/ML IV SOLN
INTRAVENOUS | Status: AC
  Filled 2019-12-01: qty 4

## 2019-12-01 MED ORDER — CEFAZOLIN SODIUM-DEXTROSE 2-4 GM/100ML-% IV SOLN
2.0000 g | INTRAVENOUS | Status: AC
  Administered 2019-12-01: 2 g via INTRAVENOUS
  Filled 2019-12-01: qty 100

## 2019-12-01 MED ORDER — THROMBIN 5000 UNITS EX SOLR
CUTANEOUS | Status: AC
  Filled 2019-12-01: qty 5000

## 2019-12-01 MED ORDER — HYDROMORPHONE HCL 1 MG/ML IJ SOLN
0.2500 mg | INTRAMUSCULAR | Status: DC | PRN
  Administered 2019-12-01: 0.5 mg via INTRAVENOUS

## 2019-12-01 MED ORDER — ACETAMINOPHEN 10 MG/ML IV SOLN
1000.0000 mg | Freq: Once | INTRAVENOUS | Status: AC
  Administered 2019-12-01: 1000 mg via INTRAVENOUS

## 2019-12-01 MED ORDER — SODIUM CHLORIDE 0.9 % IV SOLN
INTRAVENOUS | Status: DC | PRN
  Administered 2019-12-01: 500 mL

## 2019-12-01 MED ORDER — MEPERIDINE HCL 25 MG/ML IJ SOLN: 6.2500 mg | INTRAMUSCULAR | Status: DC | PRN

## 2019-12-01 MED ORDER — PROPOFOL 10 MG/ML IV BOLUS
INTRAVENOUS | Status: DC | PRN
  Administered 2019-12-01: 200 mg via INTRAVENOUS

## 2019-12-01 MED ORDER — ONDANSETRON HCL 4 MG/2ML IJ SOLN
INTRAMUSCULAR | Status: AC
  Filled 2019-12-01: qty 2

## 2019-12-01 MED ORDER — HYDROMORPHONE HCL 1 MG/ML IJ SOLN: 1.0000 mg | INTRAMUSCULAR | Status: DC | PRN

## 2019-12-01 MED ORDER — FENTANYL CITRATE (PF) 100 MCG/2ML IJ SOLN
INTRAMUSCULAR | Status: DC | PRN
  Administered 2019-12-01: 100 ug via INTRAVENOUS
  Administered 2019-12-01: 50 ug via INTRAVENOUS
  Administered 2019-12-01: 100 ug via INTRAVENOUS
  Administered 2019-12-01 (×2): 50 ug via INTRAVENOUS
  Administered 2019-12-01: 100 ug via INTRAVENOUS

## 2019-12-01 MED ORDER — LABETALOL HCL 5 MG/ML IV SOLN
INTRAVENOUS | Status: DC | PRN
  Administered 2019-12-01: 5 mg via INTRAVENOUS

## 2019-12-01 MED ORDER — CYCLOBENZAPRINE HCL 10 MG PO TABS
ORAL_TABLET | ORAL | Status: AC
  Filled 2019-12-01: qty 1

## 2019-12-01 MED ORDER — ORAL CARE MOUTH RINSE: 15.0000 mL | Freq: Once | OROMUCOSAL | Status: AC

## 2019-12-01 MED ORDER — ONDANSETRON HCL 4 MG PO TABS: 4.0000 mg | ORAL_TABLET | Freq: Four times a day (QID) | ORAL | Status: DC | PRN

## 2019-12-01 MED ORDER — ACETAMINOPHEN 10 MG/ML IV SOLN
INTRAVENOUS | Status: AC
  Filled 2019-12-01: qty 100

## 2019-12-01 MED ORDER — DEXMEDETOMIDINE HCL 200 MCG/2ML IV SOLN
INTRAVENOUS | Status: DC | PRN
  Administered 2019-12-01 (×2): 20 ug via INTRAVENOUS

## 2019-12-01 MED ORDER — SODIUM CHLORIDE 0.9 % IV SOLN
250.0000 mL | INTRAVENOUS | Status: DC
  Administered 2019-12-01: 250 mL via INTRAVENOUS

## 2019-12-01 MED ORDER — HYDROCODONE-ACETAMINOPHEN 10-325 MG PO TABS: 2.0000 | ORAL_TABLET | ORAL | Status: DC | PRN

## 2019-12-01 MED ORDER — DEXAMETHASONE SODIUM PHOSPHATE 10 MG/ML IJ SOLN
INTRAMUSCULAR | Status: DC | PRN
  Administered 2019-12-01: 10 mg via INTRAVENOUS

## 2019-12-01 MED ORDER — HYDROCODONE-ACETAMINOPHEN 10-325 MG PO TABS
1.0000 | ORAL_TABLET | ORAL | Status: DC | PRN
  Administered 2019-12-01: 2 via ORAL
  Administered 2019-12-01: 1 via ORAL
  Administered 2019-12-02 (×3): 2 via ORAL
  Filled 2019-12-01: qty 2
  Filled 2019-12-01: qty 1
  Filled 2019-12-01 (×3): qty 2

## 2019-12-01 MED ORDER — ONDANSETRON HCL 4 MG/2ML IJ SOLN: 4.0000 mg | Freq: Four times a day (QID) | INTRAMUSCULAR | Status: DC | PRN

## 2019-12-01 MED ORDER — CHLORHEXIDINE GLUCONATE CLOTH 2 % EX PADS: 6.0000 | MEDICATED_PAD | Freq: Once | CUTANEOUS | Status: DC

## 2019-12-01 MED ORDER — THROMBIN 20000 UNITS EX SOLR
CUTANEOUS | Status: DC | PRN
  Administered 2019-12-01: 20 mL via TOPICAL

## 2019-12-01 MED ORDER — PHENYLEPHRINE 40 MCG/ML (10ML) SYRINGE FOR IV PUSH (FOR BLOOD PRESSURE SUPPORT)
PREFILLED_SYRINGE | INTRAVENOUS | Status: AC
  Filled 2019-12-01: qty 10

## 2019-12-01 MED ORDER — ROCURONIUM BROMIDE 10 MG/ML (PF) SYRINGE
PREFILLED_SYRINGE | INTRAVENOUS | Status: AC
  Filled 2019-12-01: qty 10

## 2019-12-01 MED ORDER — MIDAZOLAM HCL 2 MG/2ML IJ SOLN
INTRAMUSCULAR | Status: AC
  Filled 2019-12-01: qty 2

## 2019-12-01 MED ORDER — DEXAMETHASONE SODIUM PHOSPHATE 10 MG/ML IJ SOLN
INTRAMUSCULAR | Status: AC
  Filled 2019-12-01: qty 1

## 2019-12-01 MED ORDER — PROMETHAZINE HCL 25 MG/ML IJ SOLN: 6.2500 mg | INTRAMUSCULAR | Status: DC | PRN

## 2019-12-01 MED ORDER — HYDROCODONE-ACETAMINOPHEN 5-325 MG PO TABS
1.0000 | ORAL_TABLET | ORAL | Status: DC | PRN
  Administered 2019-12-01: 1 via ORAL

## 2019-12-01 MED ORDER — ROCURONIUM BROMIDE 10 MG/ML (PF) SYRINGE
PREFILLED_SYRINGE | INTRAVENOUS | Status: DC | PRN
  Administered 2019-12-01: 60 mg via INTRAVENOUS
  Administered 2019-12-01 (×3): 10 mg via INTRAVENOUS

## 2019-12-01 MED ORDER — ACETAMINOPHEN 650 MG RE SUPP: 650.0000 mg | RECTAL | Status: DC | PRN

## 2019-12-01 MED ORDER — 0.9 % SODIUM CHLORIDE (POUR BTL) OPTIME
TOPICAL | Status: DC | PRN
  Administered 2019-12-01: 1000 mL

## 2019-12-01 MED ORDER — LIDOCAINE 2% (20 MG/ML) 5 ML SYRINGE
INTRAMUSCULAR | Status: AC
  Filled 2019-12-01: qty 5

## 2019-12-01 MED ORDER — WHITE PETROLATUM EX OINT
TOPICAL_OINTMENT | CUTANEOUS | Status: AC
  Filled 2019-12-01: qty 28.35

## 2019-12-01 MED ORDER — PHENOL 1.4 % MT LIQD
1.0000 | OROMUCOSAL | Status: DC | PRN
  Administered 2019-12-01: 1 via OROMUCOSAL
  Filled 2019-12-01: qty 177

## 2019-12-01 MED ORDER — SUGAMMADEX SODIUM 200 MG/2ML IV SOLN
INTRAVENOUS | Status: DC | PRN
  Administered 2019-12-01: 200 mg via INTRAVENOUS

## 2019-12-01 MED ORDER — THROMBIN 20000 UNITS EX SOLR
CUTANEOUS | Status: AC
  Filled 2019-12-01: qty 20000

## 2019-12-01 SURGICAL SUPPLY — 52 items
BAG DECANTER FOR FLEXI CONT (MISCELLANEOUS) ×2 IMPLANT
BAND RUBBER #18 3X1/16 STRL (MISCELLANEOUS) ×4 IMPLANT
BENZOIN TINCTURE PRP APPL 2/3 (GAUZE/BANDAGES/DRESSINGS) ×2 IMPLANT
BIT DRILL 13 (BIT) ×2 IMPLANT
BUR MATCHSTICK NEURO 3.0 LAGG (BURR) ×2 IMPLANT
CAGE PEEK 6X14X11 (Cage) ×3 IMPLANT
CANISTER SUCT 3000ML PPV (MISCELLANEOUS) ×2 IMPLANT
CARTRIDGE OIL MAESTRO DRILL (MISCELLANEOUS) ×1 IMPLANT
COVER WAND RF STERILE (DRAPES) IMPLANT
DIFFUSER DRILL AIR PNEUMATIC (MISCELLANEOUS) ×2 IMPLANT
DRAPE C-ARM 42X72 X-RAY (DRAPES) ×4 IMPLANT
DRAPE LAPAROTOMY 100X72 PEDS (DRAPES) ×2 IMPLANT
DRAPE MICROSCOPE LEICA (MISCELLANEOUS) ×2 IMPLANT
DURAPREP 6ML APPLICATOR 50/CS (WOUND CARE) ×2 IMPLANT
ELECT COATED BLADE 2.86 ST (ELECTRODE) ×2 IMPLANT
ELECT REM PT RETURN 9FT ADLT (ELECTROSURGICAL) ×2
ELECTRODE REM PT RTRN 9FT ADLT (ELECTROSURGICAL) ×1 IMPLANT
GAUZE 4X4 16PLY RFD (DISPOSABLE) IMPLANT
GAUZE SPONGE 4X4 12PLY STRL (GAUZE/BANDAGES/DRESSINGS) ×2 IMPLANT
GAUZE SPONGE 4X4 12PLY STRL LF (GAUZE/BANDAGES/DRESSINGS) ×2 IMPLANT
GLOVE ECLIPSE 9.0 STRL (GLOVE) ×2 IMPLANT
GLOVE EXAM NITRILE XL STR (GLOVE) IMPLANT
GOWN STRL REUS W/ TWL LRG LVL3 (GOWN DISPOSABLE) IMPLANT
GOWN STRL REUS W/ TWL XL LVL3 (GOWN DISPOSABLE) IMPLANT
GOWN STRL REUS W/TWL 2XL LVL3 (GOWN DISPOSABLE) IMPLANT
GOWN STRL REUS W/TWL LRG LVL3 (GOWN DISPOSABLE)
GOWN STRL REUS W/TWL XL LVL3 (GOWN DISPOSABLE)
HALTER HD/CHIN CERV TRACTION D (MISCELLANEOUS) ×2 IMPLANT
HEMOSTAT POWDER KIT SURGIFOAM (HEMOSTASIS) ×2 IMPLANT
KIT BASIN OR (CUSTOM PROCEDURE TRAY) ×2 IMPLANT
KIT TURNOVER KIT B (KITS) ×2 IMPLANT
NEEDLE SPNL 20GX3.5 QUINCKE YW (NEEDLE) ×2 IMPLANT
NS IRRIG 1000ML POUR BTL (IV SOLUTION) ×2 IMPLANT
OIL CARTRIDGE MAESTRO DRILL (MISCELLANEOUS) ×2
PACK LAMINECTOMY NEURO (CUSTOM PROCEDURE TRAY) ×2 IMPLANT
PAD ARMBOARD 7.5X6 YLW CONV (MISCELLANEOUS) ×6 IMPLANT
PLATE 3 57.5XLCK NS SPNE CVD (Plate) ×1 IMPLANT
PLATE 3 ATLANTIS TRANS (Plate) ×1 IMPLANT
SCREW ST FIX 4 ATL 3120213 (Screw) ×16 IMPLANT
SPACER SPNL 11X14X6XPEEK CVD (Cage) ×3 IMPLANT
SPCR SPNL 11X14X6XPEEK CVD (Cage) ×3 IMPLANT
SPONGE INTESTINAL PEANUT (DISPOSABLE) ×2 IMPLANT
SPONGE SURGIFOAM ABS GEL 100 (HEMOSTASIS) ×2 IMPLANT
STRIP CLOSURE SKIN 1/2X4 (GAUZE/BANDAGES/DRESSINGS) ×2 IMPLANT
SUT VIC AB 3-0 SH 8-18 (SUTURE) ×2 IMPLANT
SUT VIC AB 4-0 RB1 18 (SUTURE) ×2 IMPLANT
TAPE CLOTH 4X10 WHT NS (GAUZE/BANDAGES/DRESSINGS) ×2 IMPLANT
TAPE PAPER 3X10 WHT MICROPORE (GAUZE/BANDAGES/DRESSINGS) ×2 IMPLANT
TOWEL GREEN STERILE (TOWEL DISPOSABLE) ×2 IMPLANT
TOWEL GREEN STERILE FF (TOWEL DISPOSABLE) ×2 IMPLANT
TRAP SPECIMEN MUCUS 40CC (MISCELLANEOUS) ×2 IMPLANT
WATER STERILE IRR 1000ML POUR (IV SOLUTION) ×2 IMPLANT

## 2019-12-01 NOTE — Anesthesia Procedure Notes (Signed)
Procedure Name: Intubation Date/Time: 12/01/2019 8:16 AM Performed by: Orlie Dakin, CRNA Pre-anesthesia Checklist: Patient identified, Emergency Drugs available, Suction available and Patient being monitored Patient Re-evaluated:Patient Re-evaluated prior to induction Oxygen Delivery Method: Circle system utilized Preoxygenation: Pre-oxygenation with 100% oxygen Induction Type: IV induction Ventilation: Mask ventilation without difficulty Laryngoscope Size: Mac and 4 Grade View: Grade II Tube type: Oral Tube size: 7.0 mm Number of attempts: 1 Airway Equipment and Method: Stylet Placement Confirmation: ETT inserted through vocal cords under direct vision,  positive ETCO2 and breath sounds checked- equal and bilateral Secured at: 23 cm Tube secured with: Tape Dental Injury: Teeth and Oropharynx as per pre-operative assessment  Comments: Patient positioned neck for comfort on yellow foam padding prior to induction.  Minimal neck extension with DL and intubation.

## 2019-12-01 NOTE — Op Note (Signed)
Date of procedure: 12/01/2019  Date of dictation: Same  Service: Neurosurgery  Preoperative diagnosis: Cervical stenosis with myelopathy and radiculopathy  Postoperative diagnosis: Same  Procedure Name: C4-5, C5-6, C6-7 anterior cervical discectomy with interbody fusion utilizing interbody cages, will Kerrison autograft, and anterior plate instrumentation  Surgeon:Mercedes Valeriano A.Tia Hieronymus, M.D.  Asst. Surgeon: Johnsie Cancel  Anesthesia: General  Indication: 39 year old female with neck and bilateral upper extremity pain paresthesias and weakness failing conservative manage.  Work-up demonstrates evidence of marked cervical spondylosis with stenosis both centrally and neural foraminal.  Patient presents now for two-level anterior cervical decompression and fusion in hopes of improving her symptoms.  Operative note: After induction of anesthesia, patient positioned supine with neck slightly extended and held placed halter traction.  Patient's anterior cervical region prepped draped sterilely.  Incision made overlying C5-6.  Dissection performed in the right.  Retractor placed.  Fluoroscopy used.  Levels confirmed.  The spaces incised in all 3 levels.  Discectomies then performed using various instruments down to level of the posterior annulus.  Microscope then brought to the field and used throughout the remainder of the discectomies in all 3 levels.  Starting first at C4-5 microscope was used and high-speed drill was then used to remove osteophytes and the remaining annulus down 12 the posterior longitudinal ligament.  Posterior ligament was elevated and resected in piecemeal fashion.  Underlying thecal sac was identified.  A wide central decompression then performed undercutting the bodies of C4 and C5.  Decompression then proceeded into each neural foramina.  Wide anterior foraminotomies were performed along course exiting C5 nerve roots bilaterally.  At this point a very thorough decompression been achieved.   There was no evidence of injury to the thecal sac or nerve roots.  Procedure then repeated at C5-6 and C6-7 again without complication.  Wound was then irrigated with antibiotic solution.  [employer] anatomic peek cages were then packed with locally harvested autograft.  Cages then impacted in place and recessed slightly from anterior cortical margin.  Atlantis translational anterior cervical plate was then placed over the C4-C7 levels. Attention of fluoroscopic guidance and 13 mm fixed angle screws to each at all 4 levels.  All screws given final tightening can be solid within the bone.  Locking screws additional levels.  Final images reveal good position of cages and hardware in proper upper level with normal alignment of spine.  Wound was then irrigated one final time.  Hemostasis was assured.  Wounds and closed in layers with Vicryl sutures.  Steri-Strips and sterile dressing applied.  No apparent complications.  Patient tolerated the procedure well she returns to the recovery room postop.

## 2019-12-01 NOTE — Brief Op Note (Signed)
12/01/2019  10:42 AM  PATIENT:  Holly [name]  39 y.o. female  PRE-OPERATIVE DIAGNOSIS:  Stenosis  POST-OPERATIVE DIAGNOSIS:  Stenosis  PROCEDURE:  Procedure(s) with comments: Anterior Cervical Decompression Fusion - Cervical Four-Cervical Five - Cervical Five-Cervical Six - Cervical Six- Cervical Seven (N/A) - Anterior Cervical Decompression Fusion - Cervical Four-Cervical Five - Cervical Five-Cervical Six - Cervical Six- Cervical Seven  SURGEON:  Surgeon(s) and Role:    * Julio Sicks, MD - Primary    * Ostergard, Clovis Pu, MD - Assisting  PHYSICIAN ASSISTANT:   ASSISTANTS:    ANESTHESIA:   general  EBL:  100 mL   BLOOD ADMINISTERED:none  DRAINS: none   LOCAL MEDICATIONS USED:  NONE  SPECIMEN:  No Specimen  DISPOSITION OF SPECIMEN:  N/A  COUNTS:  YES  TOURNIQUET:  * No tourniquets in log *  DICTATION: .Dragon Dictation  PLAN OF CARE: Admit to inpatient   PATIENT DISPOSITION:  PACU - hemodynamically stable.   Delay start of Pharmacological VTE agent (>24hrs) due to surgical blood loss or risk of bleeding: yes

## 2019-12-01 NOTE — H&P (Signed)
  Holly [name] is an 39 y.o. female.   Chief Complaint: Neck pain HPI: 39 year old female with neck pain radiating into both upper extremities right greater than left.  Patient with ongoing distal upper extremity sensory loss and decreased strength and dexterity.  Work-up demonstrates evidence of significant spinal stenosis with cord compression at C4-5 and C5-6 and to lesser degree at C6-7.  Patient presents now for 3 level anterior cervical decompression and fusion in hopes of improving her symptoms.  Past Medical History:  Diagnosis Date  . Anxiety     Past Surgical History:  Procedure Laterality Date  . TUBAL LIGATION      History reviewed. No pertinent family history. Social History:  reports that she has been smoking. She has been smoking about 0.25 packs per day. She has never used smokeless tobacco. She reports current alcohol use. She reports that she does not use drugs.  Allergies: No Known Allergies  Medications Prior to Admission  Medication Sig Dispense Refill  . acetaminophen (TYLENOL) 500 MG tablet Take 1,000 mg by mouth every 6 (six) hours as needed (for pain.).       Results for orders placed or performed during the hospital encounter of 12/01/19 (from the past 48 hour(s))  Pregnancy, urine POC     Status: None   Collection Time: 12/01/19  7:12 AM  Result Value Ref Range   Preg Test, Ur NEGATIVE NEGATIVE    Comment:        THE SENSITIVITY OF THIS METHODOLOGY IS >24 mIU/mL    No results found.  Pertinent items noted in HPI and remainder of comprehensive ROS otherwise negative.  Blood pressure 135/87, pulse 64, resp. rate 18, height 5\' 9"  (1.753 m), weight 77.1 kg, last menstrual period 11/18/2019, SpO2 100 %.  Patient is awake and alert.  She is oriented and appropriate.  Speech is fluent.  Judgment insight intact.  Cranial nerve function normal bilateral motor examination extremities reveal some mild weakness of her grips and intrinsics bilaterally right  somewhat worse than left.  Sensory examination with decrease sensation pinprick light touch in her right C6 dermatome and bilateral C7 dermatomes.  Deep tendon mixes are normal active in both upper extremities and somewhat brisk in both lower extremities.  Gait is mildly spastic.  Famished head ears eyes nose throat unremarkable chest and abdomen are benign.  Extremities are free from injury or deformity. Assessment/Plan C4-5, C5-6, C6-7 spondylosis with stenosis and myelopathy.  Plan C4-5, C5-6, C6-7 anterior cervical discectomy with interbody fusion utilizing interbody cages, local harvested autograft, and anterior plate instrumentation.  Risks and benefits been explained.  Patient wishes to proceed.  11/20/2019 A Holly [name] 12/01/2019, 7:56 AM

## 2019-12-01 NOTE — Transfer of Care (Signed)
Immediate Anesthesia Transfer of Care Note  Patient: Holly [name]  Procedure(s) Performed: Anterior Cervical Decompression Fusion - Cervical Four-Cervical Five - Cervical Five-Cervical Six - Cervical Six- Cervical Seven (N/A )  Patient Location: PACU  Anesthesia Type:General  Level of Consciousness: awake, alert  and patient cooperative  Airway & Oxygen Therapy: Patient Spontanous Breathing and Patient connected to nasal cannula oxygen  Post-op Assessment: Report given to RN, Post -op Vital signs reviewed and stable and Patient moving all extremities X 4  Post vital signs: Reviewed and stable  Last Vitals:  Vitals Value Taken Time  BP 155/100 12/01/19 1058  Temp    Pulse 84 12/01/19 1058  Resp 31 12/01/19 1058  SpO2 100 % 12/01/19 1058  Vitals shown include unvalidated device data.  Last Pain:  Vitals:   12/01/19 0654  PainSc: 4       Patients Stated Pain Goal: 3 (12/01/19 0654)  Complications: No complications documented.

## 2019-12-01 NOTE — Progress Notes (Signed)
Orthopedic Tech Progress Note Patient Details:  Holly [name] 03-22-1981 [id number] Confirmed with nurse that patient has cervical collar. Patient ID: Holly [name], female   DOB: [birth year], 39 y.o.   MRN: [id number]   [name] 12/01/2019, 12:41 PM

## 2019-12-02 ENCOUNTER — Encounter (HOSPITAL_COMMUNITY): Payer: Self-pay | Admitting: Neurosurgery

## 2019-12-02 MED ORDER — CALCIUM CARBONATE ANTACID 500 MG PO CHEW: 1.0000 | CHEWABLE_TABLET | Freq: Two times a day (BID) | ORAL | Status: DC | PRN

## 2019-12-02 MED ORDER — CYCLOBENZAPRINE HCL 10 MG PO TABS: 10.0000 mg | ORAL_TABLET | Freq: Three times a day (TID) | ORAL | 0 refills | Status: AC | PRN

## 2019-12-02 MED ORDER — HYDROCODONE-ACETAMINOPHEN 5-325 MG PO TABS: 1.0000 | ORAL_TABLET | ORAL | 0 refills | Status: AC | PRN

## 2019-12-02 NOTE — Anesthesia Postprocedure Evaluation (Signed)
Anesthesia Post Note  Patient: Holly [name]  Procedure(s) Performed: Anterior Cervical Decompression Fusion - Cervical Four-Cervical Five - Cervical Five-Cervical Six - Cervical Six- Cervical Seven (N/A )     Patient location during evaluation: PACU Anesthesia Type: General Level of consciousness: sedated and patient cooperative Pain management: pain level controlled Vital Signs Assessment: post-procedure vital signs reviewed and stable Respiratory status: spontaneous breathing Cardiovascular status: stable Anesthetic complications: no   No complications documented.  Last Vitals:  Vitals:   12/02/19 0309 12/02/19 0718  BP: (!) 141/89 129/89  Pulse: 67 81  Resp: 18 18  Temp: 36.8 C 37 C  SpO2: 100% 100%    Last Pain:  Vitals:   12/02/19 0718  TempSrc: Oral  PainSc:                  Lewie Loron

## 2019-12-02 NOTE — Plan of Care (Signed)
Patient alert and oriented, mae's well, voiding adequate amount of urine, swallowing without difficulty, no c/o pain at time of discharge. Patient discharged home with family. Script and discharged instructions given to patient. Patient and family stated understanding of instructions given. Patient has an appointment with Dr. Pool  

## 2019-12-02 NOTE — Evaluation (Signed)
Occupational Therapy Evaluation Patient Details Name: Holly [name] MRN: [id number] DOB: 10-28-1980 Today's Date: 12/02/2019    History of Present Illness Pt is a 39 y.o. female with PMH of hepatitis B,  leukocytopenia, and anxiety presenting for procedure with neck pain radiating into both upper extremities right greater than left, distal upper extremity sensory loss and decreased strength and dexterity. S/p C4-C7 anterior cervical discectomy with interbody fusion on 12/01/19.   Clinical Impression   PTA pt reports being independent with all ADLs and IADLs. Pt was admitted for above and treated for problem list below (see OT Problem List). Requires supervision with verbal cues for ADLs due to decreased awareness of precautions and compensatory techniques. Requires supervision with transfers and ambulation for unsteadiness on feet. Education was administered on precautions, compensatory techniques for bathing, dressing, grooming, and tub transfers for pt safety in the home. Pt accepted and demonstrated techniques with good recall and carry over. Believe pt would benefit from 24/7 supervision at home for safety - pt able to confirm 24/7 support from children. OT to sign off. Thank you for referral.     Follow Up Recommendations  No OT follow up;Supervision/Assistance - 24 hour    Equipment Recommendations  3 in 1 bedside commode       Precautions / Restrictions Precautions Precautions: Cervical;Fall Precaution Booklet Issued: Yes (comment) Precaution Comments: Reviewed precautions with pt Required Braces or Orthoses: Cervical Brace Cervical Brace: Soft collar;At all times Restrictions Weight Bearing Restrictions: No      Mobility Bed Mobility Overal bed mobility: Needs Assistance Bed Mobility: Rolling;Sidelying to Sit;Sit to Supine Rolling: Supervision Sidelying to sit: Supervision   Sit to supine: Supervision   General bed mobility comments: supervision for proper technique  of log roll  Transfers Overall transfer level: Needs assistance Equipment used: None Transfers: Sit to/from Stand Sit to Stand: Supervision         General transfer comment: supervision for safety    Balance Overall balance assessment: Mild deficits observed, not formally tested                                         ADL either performed or assessed with clinical judgement   ADL Overall ADL's : Needs assistance/impaired     Grooming: Supervision/safety;Standing;Cueing for compensatory techniques Grooming Details (indicate cue type and reason): educated on grooming at sink Upper Body Bathing: Supervision/ safety;Sitting;Standing Upper Body Bathing Details (indicate cue type and reason): supervision for safety and maintain cervical precautions Lower Body Bathing: Supervison/ safety;Sit to/from stand;Sitting/lateral leans (adhering to cervical precautions) Lower Body Bathing Details (indicate cue type and reason): supervision for safety and maintain cervical precautions Upper Body Dressing : Standing;Sitting;Supervision/safety Upper Body Dressing Details (indicate cue type and reason): supervision for safety and maintain cervical precautions Lower Body Dressing: Sit to/from stand;Supervision/safety (adhering to cervical precautions) Lower Body Dressing Details (indicate cue type and reason): supervision for safety and maintain cervical precautions Toilet Transfer: Supervision/safety;BSC (adhering to cervical precautions) Toilet Transfer Details (indicate cue type and reason): supervision for safety and maintain cervical precautions Toileting- Clothing Manipulation and Hygiene: Sitting/lateral lean;Sit to/from stand;Supervision/safety (adhering to cervical precautions) Toileting - Clothing Manipulation Details (indicate cue type and reason): supervision for safety and maintain cervical precautions Tub/ Shower Transfer: Supervision/safety;Ambulation;3 in  1 Tub/Shower Transfer Details (indicate cue type and reason): simulated stepping over side of tub in room with supervision Functional mobility during ADLs: Supervision/safety  General ADL Comments: Pt presenting with pain and decreased knowledge of precautions. supervision for safety and maintain cervical precautions.                  Pertinent Vitals/Pain Pain Assessment: Faces Faces Pain Scale: Hurts a little bit Pain Location: neck/throat Pain Descriptors / Indicators: Discomfort;Grimacing;Guarding;Operative site guarding Pain Intervention(s): Monitored during session     Hand Dominance Right   Extremity/Trunk Assessment Upper Extremity Assessment Upper Extremity Assessment: Overall WFL for tasks assessed   Lower Extremity Assessment Lower Extremity Assessment: Defer to PT evaluation   Cervical / Trunk Assessment Cervical / Trunk Assessment: Other exceptions Cervical / Trunk Exceptions: s/p cervical surgery   Communication Communication Communication: No difficulties   Cognition Arousal/Alertness: Awake/alert Behavior During Therapy: WFL for tasks assessed/performed Overall Cognitive Status: Within Functional Limits for tasks assessed                                     General Comments  Pt with decreased knowledge of precautions. Pt educated on precautions, DME, and compensatory tecchniques for dressing, bathing, and grooming.            Home Living Family/patient expects to be discharged to:: Private residence Living Arrangements: Children Available Help at Discharge: Available 24 hours/day Type of Home: Apartment Home Access: Stairs to enter [employer] of Steps: 8 stairs (1 flight) Entrance Stairs-Rails: Right Home Layout: One level     Bathroom Shower/Tub: Chief Strategy Officer: Standard     Home Equipment: None   Additional Comments: Pt reports 24/7 supervision from children upon dc      Prior  Functioning/Environment Level of Independence: Independent                 OT Problem List: Impaired balance (sitting and/or standing);Decreased safety awareness;Decreased knowledge of use of DME or AE;Decreased knowledge of precautions;Pain      OT Treatment/Interventions:      OT Goals(Current goals can be found in the care plan section) Acute Rehab OT Goals Patient Stated Goal: go home OT Goal Formulation: With patient/family Time For Goal Achievement: 12/16/19 Potential to Achieve Goals: Good                AM-PAC OT "6 Clicks" Daily Activity     Outcome Measure Help from another person eating meals?: None Help from another person taking care of personal grooming?: A Little Help from another person toileting, which includes using toliet, bedpan, or urinal?: A Little Help from another person bathing (including washing, rinsing, drying)?: A Little Help from another person to put on and taking off regular upper body clothing?: A Little Help from another person to put on and taking off regular lower body clothing?: A Little 6 Click Score: 19   End of Session Equipment Utilized During Treatment: Cervical collar;Gait belt Nurse Communication: Mobility status  Activity Tolerance: Patient tolerated treatment well Patient left: in bed;with call bell/phone within reach;with bed alarm set;with family/visitor present (son present)  OT Visit Diagnosis: Unsteadiness on feet (R26.81);Other symptoms and signs involving the nervous system (R29.898);Pain Pain - part of body:  (neck/throat)                Time: 7673-4193 OT Time Calculation (min): 15 min Charges:  OT General Charges $OT Visit: 1 Visit OT Evaluation $OT Eval Low Complexity: 1 Low  Holly [name]/OTS  Holly [name] 12/02/2019, 10:10 AM

## 2019-12-02 NOTE — Discharge Summary (Signed)
Physician Discharge Summary  Patient ID: Holly [name] MRN: [id number] DOB/AGE: 41-Jun-1982 41 y.o.  Admit date: 12/01/2019 Discharge date: 12/02/2019  Admission Diagnoses:  Discharge Diagnoses:  Active Problems:   Cervical spondylosis with myelopathy and radiculopathy   Discharged Condition: good  Hospital Course: Patient admitted to the hospital where she underwent uncomplicated 3 level anterior cervical decompression and fusion.  Postop Holly [name] doing well.  Preoperative neck and upper extremity symptoms resolved.  Standing ambulating and voiding without difficulty.  Ready for discharge home.  Consults:   Significant Diagnostic Studies:   Treatments:   Discharge Exam: Blood pressure 129/89, pulse 81, temperature 98.6 F (37 C), temperature source Oral, resp. rate 18, height 5\' 9"  (1.753 m), weight 77.1 kg, last menstrual period 11/18/2019, SpO2 100 %. Awake and alert.  Oriented and appropriate.  Motor and sensory function intact.  Wound clean and dry.  Chest and abdomen benign.  Neck soft.  Voice strong.  Disposition: Discharge disposition: 01-Home or Self Care        Allergies as of 12/02/2019   No Known Allergies     Medication List    TAKE these medications   acetaminophen 500 MG tablet Commonly known as: TYLENOL Take 1,000 mg by mouth every 6 (six) hours as needed (for pain.).   cyclobenzaprine 10 MG tablet Commonly known as: FLEXERIL Take 1 tablet (10 mg total) by mouth 3 (three) times daily as needed for muscle spasms.   HYDROcodone-acetaminophen 5-325 MG tablet Commonly known as: NORCO/VICODIN Take 1 tablet by mouth every 4 (four) hours as needed for moderate pain ((score 4 to 6)).            Durable Medical Equipment  (From admission, onward)         Start     Ordered   12/02/19 0840  For home use only DME 3 n 1  Once        12/02/19 12/04/19          Follow-up Information    4268, MD Follow up.   Specialty: Neurosurgery Contact  information: [address] [place] [phone]               Signed: [phone] 12/02/2019, 11:57 AM

## 2019-12-02 NOTE — Discharge Instructions (Signed)
Wound Care Keep incision area dry.  You may remove outer bandage after 2 days and shower.   Do not put any creams, lotions, or ointments on incision. Leave steri-strips on neck.  They will fall off by themselves. Activity Walk each and every day, increasing distance each day. No lifting greater than 5 lbs.  Avoid excessive neck motion. No driving for 2 weeks; may ride as a passenger locally. Wear neck brace at all times except when showering. Diet Resume your normal diet.  Return to Work Will be discussed at you follow up appointment. Call Your Doctor If Any of These Occur Redness, drainage, or swelling at the wound.  Temperature greater than 101 degrees. Severe pain not relieved by pain medication. Increased difficulty swallowing.  Incision starts to come apart. Follow Up Appt Call  [phone])  for problems.  If you have any hardware placed in your spine, you will need an x-ray before your appointment.  

## 2021-02-13 ENCOUNTER — Other Ambulatory Visit: Payer: Self-pay

## 2021-02-13 DIAGNOSIS — F17210 Nicotine dependence, cigarettes, uncomplicated: Secondary | ICD-10-CM | POA: Insufficient documentation

## 2021-02-13 DIAGNOSIS — Y92410 Unspecified street and highway as the place of occurrence of the external cause: Secondary | ICD-10-CM | POA: Insufficient documentation

## 2021-02-13 DIAGNOSIS — S161XXA Strain of muscle, fascia and tendon at neck level, initial encounter: Secondary | ICD-10-CM | POA: Diagnosis not present

## 2021-02-13 DIAGNOSIS — S199XXA Unspecified injury of neck, initial encounter: Secondary | ICD-10-CM | POA: Diagnosis present
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
## 2021-02-14 ENCOUNTER — Emergency Department
Admission: EM | Admit: 2021-02-14 | Discharge: 2021-02-14 | Disposition: A | Discharge status: Home / Self Care | Payer: Medicaid Other | Attending: Emergency Medicine | Admitting: Emergency Medicine

## 2021-02-14 DIAGNOSIS — T148XXA Other injury of unspecified body region, initial encounter: Secondary | ICD-10-CM

## 2021-02-14 MED ORDER — OXYCODONE-ACETAMINOPHEN 5-325 MG PO TABS: 1.0000 | ORAL_TABLET | Freq: Four times a day (QID) | ORAL | 0 refills | Status: AC | PRN
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
## 2021-05-02 ENCOUNTER — Other Ambulatory Visit: Payer: Self-pay | Admitting: Infectious Diseases

## 2021-05-02 DIAGNOSIS — Z1231 Encounter for screening mammogram for malignant neoplasm of breast: Secondary | ICD-10-CM

## 2021-05-10 ENCOUNTER — Other Ambulatory Visit (HOSPITAL_COMMUNITY): Payer: Self-pay | Admitting: Physical Medicine & Rehabilitation

## 2021-05-10 ENCOUNTER — Other Ambulatory Visit: Payer: Self-pay | Admitting: Physical Medicine & Rehabilitation

## 2021-05-10 DIAGNOSIS — M542 Cervicalgia: Secondary | ICD-10-CM

## 2021-05-10 DIAGNOSIS — G8929 Other chronic pain: Secondary | ICD-10-CM

## 2021-05-10 DIAGNOSIS — M5442 Lumbago with sciatica, left side: Secondary | ICD-10-CM

## 2021-05-31 ENCOUNTER — Ambulatory Visit: Payer: No Typology Code available for payment source

## 2021-05-31 ENCOUNTER — Other Ambulatory Visit: Payer: Medicaid Other

## 2021-06-01 ENCOUNTER — Ambulatory Visit: Payer: Medicaid Other

## 2021-06-12 ENCOUNTER — Ambulatory Visit
Admission: RE | Admit: 2021-06-12 | Discharge: 2021-06-12 | Disposition: A | Discharge status: Home / Self Care | Payer: Medicaid Other | Source: Ambulatory Visit | Attending: Physical Medicine & Rehabilitation | Admitting: Physical Medicine & Rehabilitation

## 2021-06-12 DIAGNOSIS — M5441 Lumbago with sciatica, right side: Secondary | ICD-10-CM | POA: Diagnosis present

## 2021-06-12 DIAGNOSIS — M542 Cervicalgia: Secondary | ICD-10-CM

## 2021-06-12 DIAGNOSIS — G8929 Other chronic pain: Secondary | ICD-10-CM

## 2021-06-12 DIAGNOSIS — M5442 Lumbago with sciatica, left side: Secondary | ICD-10-CM | POA: Insufficient documentation

## 2022-06-08 ENCOUNTER — Ambulatory Visit: Payer: No Typology Code available for payment source | Admitting: Physical Therapy

## 2022-06-08 NOTE — Therapy (Deleted)
OUTPATIENT PHYSICAL THERAPY SHOULDER EVALUATION   Patient Name: Holly [name] MRN: [id number] DOB:[birth year], 42 y.o., female Today's Date: 06/08/2022    Past Medical History:  Diagnosis Date   Anxiety    Past Surgical History:  Procedure Laterality Date   ANTERIOR CERVICAL DECOMP/DISCECTOMY FUSION N/A 12/01/2019   Procedure: Anterior Cervical Decompression Fusion - Cervical Four-Cervical Five - Cervical Five-Cervical Six - Cervical Six- Cervical Seven;  Surgeon: Earnie Larsson, MD;  Location: [facility];  Service: Neurosurgery;  Laterality: N/A;  Anterior Cervical Decompression Fusion - Cervical Four-Cervical Five - Cervical Five-Cervical Six - Cervical Six- Cervical Seven   TUBAL LIGATION     Patient Active Problem List   Diagnosis Date Noted   Cervical spondylosis with myelopathy and radiculopathy 12/01/2019    PCP: Patient, No Pcp Per  REFERRING PROVIDER: Dulce Sellar, MD  THERAPY DIAG:  No diagnosis found.  REFERRING DIAG: Cervicalgia [M54.2]   Rationale for Evaluation and Treatment:  Rehabilitation  SUBJECTIVE:  PERTINENT PAST HISTORY:  Cervical surgery 2022 ***        PRECAUTIONS: {Therapy precautions:24002}  WEIGHT BEARING RESTRICTIONS No  FALLS:  Has patient fallen in last 6 months? {yes/no:20286}, Number of falls: ***  MOI/History of condition:  Onset date: ***  SUBJECTIVE STATEMENT  Holly [name] is a 42 y.o. female who presents to clinic with chief complaint of ***.  ***  From referring provider:      Red flags:  {has/denies:26543} {kerredflag:26542}  Pain:  Are you having pain? {yes/no:20286} Pain location: *** NPRS scale:  {NUMBERS; 0-10:5044}/10 to {NUMBERS; 0-10:5044}/10 Aggravating factors: *** Relieving factors: *** Pain description: {PAIN DESCRIPTION:21022940} Stage: {Desc; acute/subacute/chronic:13799} Stability: {kerbetterworse:26715} 24 hour pattern: ***   Occupation: ***  Assistive Device: ***  Hand Dominance:  ***  Patient Goals/Specific Activities: ***   OBJECTIVE:   DIAGNOSTIC FINDINGS:  MRI 1/23  IMPRESSION: 1. Interval ACDF at C4-C7 without residual spinal stenosis. Residual mild left C6 and C7 foraminal narrowing related to uncovertebral disease. 2. Left eccentric disc bulge with uncovertebral hypertrophy at C7-T1 with resultant moderate left C8 foraminal stenosis, stable. 3. Disc bulge with uncovertebral hypertrophy at C2-3 with resultant moderate left C3 foraminal stenosis, stable. 4. Left foraminal disc protrusion at T1-2 with resultant moderate left foraminal stenosis. 5. Right foraminal disc protrusion at T2-3 with resultant moderate right foraminal stenosis.  GENERAL OBSERVATION:  ***     SENSATION:  Light touch: {intact/deficits:24005}   PALPATION: ***  Cervical ROM  ROM ROM  06/08/2022  Flexion ***  Extension ***  Right lateral flexion ***  Left lateral flexion ***  Right rotation ***  Left rotation ***  Flexion rotation (normal is 30 degrees)   Flexion rotation (normal is 30 degrees)     (Blank rows = not tested, N = WNL, * = concordant pain)  UPPER EXTREMITY MMT:  MMT Right 06/08/2022 Left 06/08/2022  Shoulder flexion *** ***  Shoulder abduction (C5) *** ***  Shoulder ER    Shoulder IR    Middle trapezius    Lower trapezius    Shoulder extension    Grip strength    Shoulder shrug (C4) *** ***  Elbow flexion (C6) *** ***  Elbow ext (C7) *** ***  Thumb ext (C8) *** ***  Finger abd (T1) *** ***  DNF endurance ***    (Blank rows = not tested, score listed is out of 5 possible points.  N = WNL, D = diminished, C = clear for gross weakness with myotome testing, * = concordant  pain with testing)   SPECIAL TESTS:  ***  JOINT MOBILITY TESTING:  ***  PATIENT SURVEYS:  {rehab surveys:24030:a}    TODAY'S TREATMENT:  ***    PATIENT EDUCATION:  POC, diagnosis, prognosis, HEP, and outcome measures.  Pt educated via explanation,  demonstration, and handout (HEP).  Pt confirms understanding verbally.    HOME EXERCISE PROGRAM: ***  ASTERISK SIGNS   Asterisk Signs Eval (06/08/2022)                                                 ASSESSMENT:  CLINICAL IMPRESSION: Holly [name] is a 42 y.o. female who presents to clinic with signs and sxs consistent with ***.    OBJECTIVE IMPAIRMENTS: Pain, ***  ACTIVITY LIMITATIONS: ***  PERSONAL FACTORS: See medical history and pertinent history   REHAB POTENTIAL: {rehabpotential:25112}  CLINICAL DECISION MAKING: {clinical decision making:25114}  EVALUATION COMPLEXITY: {Evaluation complexity:25115}   GOALS:   SHORT TERM GOALS: Target date: 07/06/2022  Holly [name] will be >75% HEP compliant to improve carryover between sessions and facilitate independent management of condition  Evaluation (06/08/2022): ongoing Goal status: INITIAL   LONG TERM GOALS: Target date: 08/03/2022  Holly [name] will show a >/= *** pt improvement in their NDI score (MCID 7.5 pts) as a proxy for functional improvement  Evaluation/Baseline (06/08/2022): *** pts Goal status: INITIAL   2.  Holly [name] will self report >/= 50% decrease in pain from evaluation   Evaluation/Baseline (06/08/2022): ***/10 max pain Goal status: INITIAL   3.  ***   4.  ***   5.  ***   6.  ***   PLAN: PT FREQUENCY: 1-2x/week  PT DURATION: 8 weeks (Ending 08/03/2022)  PLANNED INTERVENTIONS: Therapeutic exercises, Aquatic therapy, Therapeutic activity, Neuro Muscular re-education, Gait training, Patient/Family education, Joint mobilization, Dry Needling, Electrical stimulation, Spinal mobilization and/or manipulation, Moist heat, Taping, Vasopneumatic device, Ionotophoresis 50m/ml Dexamethasone, and Manual therapy  PLAN FOR NEXT SESSION: ***   KShearon BaloPT, DPT 06/08/2022, 7:39 AM

## 2022-06-13 NOTE — Therapy (Signed)
OUTPATIENT PHYSICAL THERAPY CERVICAL EVALUATION   Patient Name: Holly [name] MRN: [id number] DOB:06/11/1980, 42 y.o., female Today's Date: 06/14/2022  END OF SESSION:  PT End of Session - 06/14/22 1057     Visit Number 1    Number of Visits 17    Date for PT Re-Evaluation 08/09/22    Authorization Type [employer] [place]    Authorization Time Period auth TBD    PT Start Time 1058    PT Stop Time 1144    PT Time Calculation (min) 46 min    Activity Tolerance Patient tolerated treatment well;No increased pain    Behavior During Therapy WFL for tasks assessed/performed             Past Medical History:  Diagnosis Date   Anxiety    Past Surgical History:  Procedure Laterality Date   ANTERIOR CERVICAL DECOMP/DISCECTOMY FUSION N/A 12/01/2019   Procedure: Anterior Cervical Decompression Fusion - Cervical Four-Cervical Five - Cervical Five-Cervical Six - Cervical Six- Cervical Seven;  Surgeon: Earnie Larsson, MD;  Location: [place];  Service: Neurosurgery;  Laterality: N/A;  Anterior Cervical Decompression Fusion - Cervical Four-Cervical Five - Cervical Five-Cervical Six - Cervical Six- Cervical Seven   TUBAL LIGATION     Patient Active Problem List   Diagnosis Date Noted   Cervical spondylosis with myelopathy and radiculopathy 12/01/2019    PCP: no PCP in chart  REFERRING PROVIDER: Dulce Sellar, MD  REFERRING DIAG: M54.2 (ICD-10-CM) - Cervicalgia  THERAPY DIAG:  Cervicalgia  Left shoulder pain, unspecified chronicity  Stiffness of left shoulder, not elsewhere classified  Abnormal posture  Muscle weakness (generalized)  Rationale for Evaluation and Treatment: Rehabilitation  ONSET DATE: a couple of years gradually worsening  SUBJECTIVE:                                                                                                                                                                                                         SUBJECTIVE  STATEMENT: Pt states prior to cervical surgery she was having R shoulder and neck pain that has improved since surgery. Pt states after surgery she had gradual return of her neck pain with addition of L shoulder pain. Worse at end of the day, worst when she is active during the day. Headaches when pain is worst, frontal distribution, eases up when neck pain eases. Reports B tingling in fingers which she states improves with donning of gloves.  Pt also reports some R hip pain since last week, no MOI reported. Describes as groin pain.  Unremarkable red  flag questioning. Pt states she did have imaging at most recent MD appt, unsure of results, states she sees MD in a few days and will follow up about imaging.  PERTINENT HISTORY:  ACDF 2021 (C4-C7)  PAIN:  Are you having pain: no neck pain at present Location: neck, L shoulder, and R hip How would you describe your pain? Aching, sharp Best in past week: 0/10 Worst in past week: 8/10 Aggravating factors: lying down (incline), raising L arm, lifting Easing factors: resting, heating pad, pain reliever   PRECAUTIONS: None  WEIGHT BEARING RESTRICTIONS: No  FALLS:  Has patient fallen in last 6 months? No  LIVING ENVIRONMENT: Lives w/ 3 daughters (48-47 years of age) Top floor apartment ~10steps 1 rail   OCCUPATION: part time IT sales professional, has to lift boxes, on feet a lot  PLOF: Independent  PATIENT GOALS: reduce pain and move more  NEXT MD VISIT: 06/19/22  OBJECTIVE:   DIAGNOSTIC FINDINGS:  No recent imaging - cervical and lumbar MRI taken in Jan 2023 Cervical impression: IMPRESSION: 1. Interval ACDF at C4-C7 without residual spinal stenosis. Residual mild left C6 and C7 foraminal narrowing related to uncovertebral disease. 2. Left eccentric disc bulge with uncovertebral hypertrophy at C7-T1 with resultant moderate left C8 foraminal stenosis, stable. 3. Disc bulge with uncovertebral hypertrophy at C2-3 with resultant moderate  left C3 foraminal stenosis, stable. 4. Left foraminal disc protrusion at T1-2 with resultant moderate left foraminal stenosis. 5. Right foraminal disc protrusion at T2-3 with resultant moderate right foraminal stenosis.  PATIENT SURVEYS:  NDI: NT due to time constraints, plan to administer next session  COGNITION: Overall cognitive status: Within functional limits for tasks assessed  SENSATION/NEURO: Light touch intact all extremities aside from reduced L C4 and C5, R T1, BLE intact  No clonus either LE Negative hoffmann and tromner sign  No ataxia with gait   POSTURE: significantly guarded posture, elevated UT BIL  PALPATION: Concordant tenderness B LS/UT, reproduction of RUE distal symptoms with palpation of trigger point R LS. Improves w/ STM   CERVICAL ROM:   Active ROM A/PROM (deg) eval  Flexion 75% * s  Extension 75%   Right lateral flexion 50% * s  Left lateral flexion 50% * s  Right rotation 25 deg s  Left rotation 60 deg   (Blank rows = not tested) (Key: WFL = within functional limits not formally assessed, * = concordant pain, s = stiffness/stretching sensation, NT = not tested)   UPPER EXTREMITY ROM:  A/PROM Right eval Left eval  Shoulder flexion 160 135 * s  Shoulder abduction    Shoulder internal rotation    Shoulder external rotation    Elbow flexion    Elbow extension    Wrist flexion    Wrist extension     (Blank rows = not tested) (Key: WFL = within functional limits not formally assessed, * = concordant pain, s = stiffness/stretching sensation, NT = not tested)  Comments:    UPPER EXTREMITY MMT:  MMT Right eval Left eval  Shoulder flexion 4+ 4+  Shoulder extension    Shoulder abduction 4+ 4  Shoulder extension    Shoulder internal rotation 4 4  Shoulder external rotation 4 4  Elbow flexion    Elbow extension    Grip strength    (Blank rows = not tested)  (Key: WFL = within functional limits not formally assessed, * = concordant  pain, s = stiffness/stretching sensation, NT = not tested)  Comments:  CERVICAL SPECIAL TESTS:  Deferred given presence of surgical hardware  FUNCTIONAL TESTS:  Overhead reach limited L vs R by pain and stiffness (see specific measurements above)  Sit to stand: requires B UE support from standard chair with trunk lean to L  TODAY'S TREATMENT:                                                                                                                              [facility] Adult PT Treatment:                                                DATE: 06/14/22 Therapeutic Exercise: Scap retraction x10 cues for HEP  Levator scap stretch 30sec B cues for HEP  PATIENT EDUCATION:  Education details: Pt education on PT impairments, prognosis, and POC. Informed consent. Rationale for interventions, safe/appropriate HEP performance Person educated: Patient Education method: Explanation, Demonstration, Tactile cues, Verbal cues, and Handouts Education comprehension: verbalized understanding, returned demonstration, verbal cues required, tactile cues required, and needs further education    HOME EXERCISE PROGRAM: Access Code: [email or web address] URL: [email or web address] Date: 06/14/2022 Prepared by: Enis Slipper  Exercises - Seated Scapular Retraction  - 1 x daily - 7 x weekly - 3 sets - 10 reps - Gentle Levator Scapulae Stretch  - 1 x daily - 7 x weekly - 1 sets - 3 reps - 30sec hold  ASSESSMENT:  CLINICAL IMPRESSION: Patient is a pleasant 42 y.o. woman who was seen today for physical therapy evaluation and treatment for chronic neck pain and L shoulder pain. Pt reports increased pain with daily activities (particularly lifting and reaching overhead) that has been gradually worsening since her ACDF in 2021. Red flag questioning/screen unremarkable on this date. On exam, pt demos significant deficits in cervical mobility and TTP throughout periscapular/cervical musculature that is concordant  and responds well to brief STM. Pt tolerates HEP well with cues for appropriate form, reports improved symptoms with repetition. No adverse events. Discussed PT POC at length, pt receptive and agreeable. Recommend skilled PT to address aforementioned deficits to maximize tolerance to daily activities. Pt departs today's session in no acute distress, all voiced questions/concerns addressed appropriately from PT perspective.    OBJECTIVE IMPAIRMENTS: decreased activity tolerance, decreased endurance, decreased mobility, decreased ROM, decreased strength, hypomobility, increased muscle spasms, impaired flexibility, impaired sensation, impaired UE functional use, postural dysfunction, and pain.   ACTIVITY LIMITATIONS: carrying, lifting, bending, sitting, stairs, transfers, and reach over head  PARTICIPATION LIMITATIONS: meal prep, cleaning, laundry, driving, community activity, and occupation  PERSONAL FACTORS: Time since onset of injury/illness/exacerbation and 1-2 comorbidities: anxiety, surgical hx  are also affecting patient's functional outcome.   REHAB POTENTIAL: Good  CLINICAL DECISION MAKING: Evolving/moderate complexity  EVALUATION COMPLEXITY: Moderate   GOALS: Goals reviewed with patient? No  SHORT TERM GOALS: Target date:  07/12/2022 Pt will demonstrate appropriate understanding and performance of initially prescribed HEP in order to facilitate improved independence with management of symptoms.  Baseline: HEP provided on eval Goal status: INITIAL   2. Pt will score improve greater than 1x MCID on NDI in order to demonstrate improved perception of function due to symptoms.  Baseline: NT on eval given time constraints, plan to assess at next visit as able  Goal status: INITIAL    LONG TERM GOALS: Target date: 08/09/2022  Pt will improve greater than 2x MCID on NDI in order to demonstrate improved perception of function due to symptoms. Baseline: NT on eval given time constraints,  plan to assess at next visit as able Goal status: INITIAL  2. Pt will demonstrate at least 50 degrees of active cervical rotation ROM towards R in order to demonstrate improved environmental awareness and safety with driving.  Baseline: see ROM chart above Goal status: INITIAL  3. Pt will demonstrate at least 4+/5 shoulder MMT globally for improved symmetry of UE strength and improved tolerance to functional movements.  Baseline: see MMT chart above Goal status: INITIAL   4. Pt will report/demonstrate ability to lift up to 25# with less than 2 point increase on NPS in order to demonstrate improved tolerance to work activities. Baseline: up to 8/10 pain with functional activities Goal status: INITIAL    PLAN:  PT FREQUENCY: 2x/week  PT DURATION: 8 weeks  PLANNED INTERVENTIONS: Therapeutic exercises, Therapeutic activity, Neuromuscular re-education, Balance training, Gait training, Patient/Family education, Self Care, Joint mobilization, Aquatic Therapy, Dry Needling, Spinal mobilization, Cryotherapy, Moist heat, Taping, Manual therapy, and Re-evaluation  PLAN FOR NEXT SESSION: Progress ROM/strengthening exercises as able/appropriate, review HEP. STM as indicated    Leeroy Cha PT, DPT 06/14/2022 12:27 PM    Check all possible CPT codes: [phone] - PT Re-evaluation, 97110- Therapeutic Exercise, [phone]- Neuro Re-education, [phone] - Gait Training, [phone] - Manual Therapy, [phone] - Therapeutic Activities, [phone] - Self Care, and [phone] - Aquatic therapy    Check all conditions that are expected to impact treatment: Musculoskeletal disorders   If treatment provided at initial evaluation, no treatment charged due to lack of authorization.

## 2022-06-14 ENCOUNTER — Encounter: Payer: Self-pay | Admitting: Physical Therapy

## 2022-06-14 ENCOUNTER — Ambulatory Visit: Payer: Medicaid Other | Attending: Adult Medicine | Admitting: Physical Therapy

## 2022-06-14 ENCOUNTER — Other Ambulatory Visit: Payer: Self-pay

## 2022-06-14 DIAGNOSIS — M6281 Muscle weakness (generalized): Secondary | ICD-10-CM | POA: Diagnosis present

## 2022-06-14 DIAGNOSIS — M25512 Pain in left shoulder: Secondary | ICD-10-CM | POA: Diagnosis present

## 2022-06-14 DIAGNOSIS — R293 Abnormal posture: Secondary | ICD-10-CM

## 2022-06-14 DIAGNOSIS — M25612 Stiffness of left shoulder, not elsewhere classified: Secondary | ICD-10-CM | POA: Diagnosis present

## 2022-06-14 DIAGNOSIS — M542 Cervicalgia: Secondary | ICD-10-CM | POA: Insufficient documentation

## 2023-03-29 IMAGING — MR MR LUMBAR SPINE W/O CM
5 series · 31 of 48 positions shown · non-contrast
Comparison: None available.

CLINICAL DATA: Initial evaluation for lower back pain for 1 year
with radiation into both lower extremities.

EXAM:
MRI LUMBAR SPINE WITHOUT CONTRAST
TECHNIQUE: Multiplanar, multisequence MR imaging of the lumbar spine was
performed. No intravenous contrast was administered.

[Series 16: T2 · sagittal · 4.0mm · 0.81mm/px · 6 of 15 slices shown (1 of 2)]
[im 1/15]
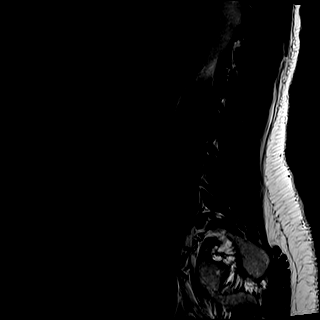
[im 3/15]
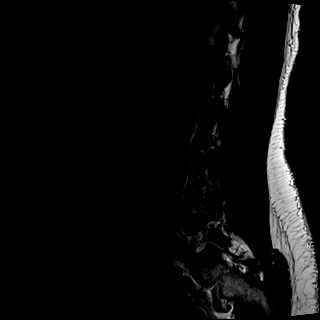
[im 6/15]
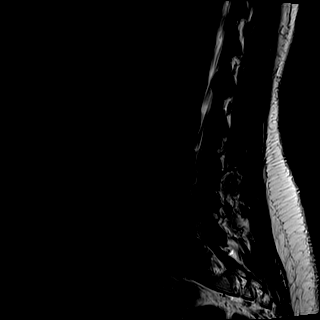
[im 9/15]
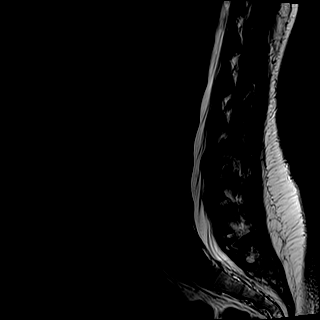
[im 12/15]
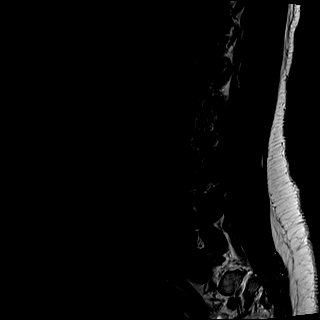
[im 15/15]
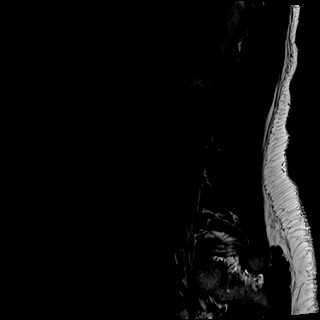

[Series 17: T1 · sagittal · 4.0mm · 0.81mm/px · 6 of 15 slices shown (1 of 2)]
[im 1/15]
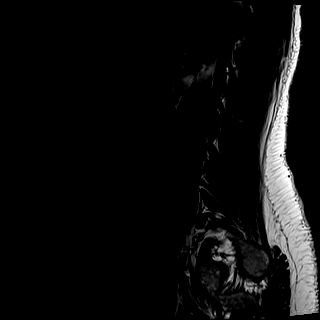
[im 3/15]
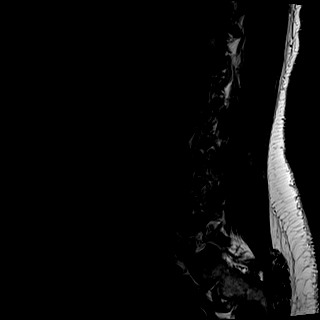
[im 6/15]
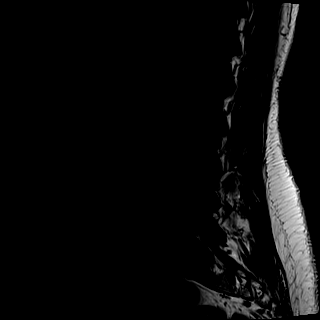
[im 9/15]
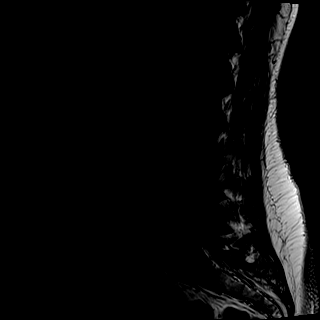
[im 12/15]
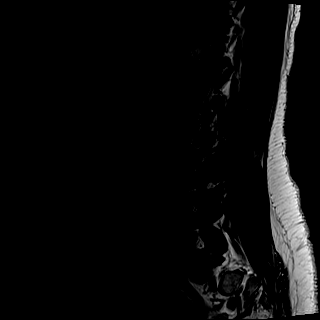
[im 15/15]
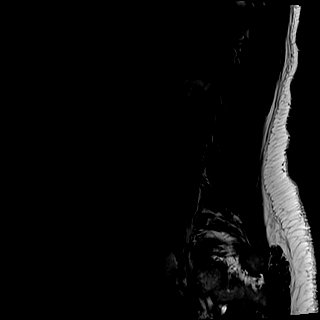

[Series 18: STIR · sagittal · 4.0mm · 0.41mm/px · 1 of 15 slices shown]
[im 1/15]
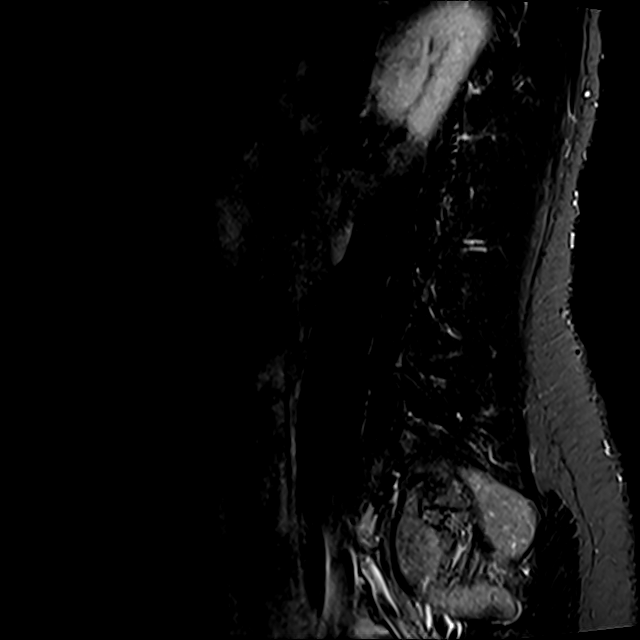

[Series 19: T2 · axial · 4.0mm · 0.78mm/px · z∈[-556,-329]mm · 9 of 36 slices shown (2 of 2)]
[im 1/36]
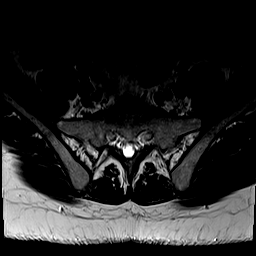
[im 6/36]
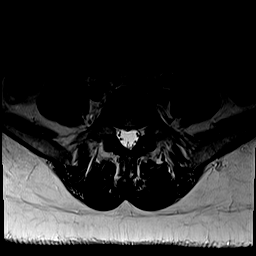
[im 11/36]
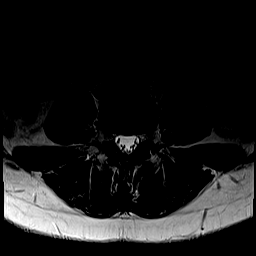
[im 16/36]
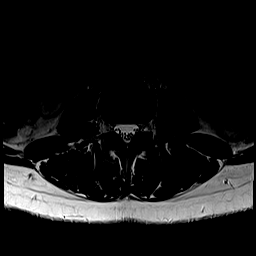
[im 18/36]
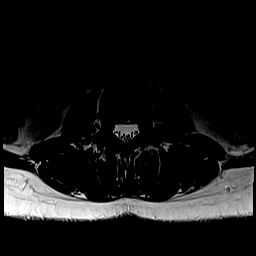
[im 21/36]
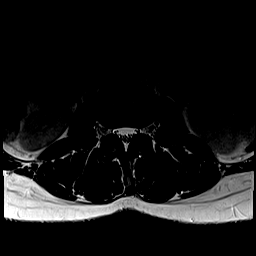
[im 26/36]
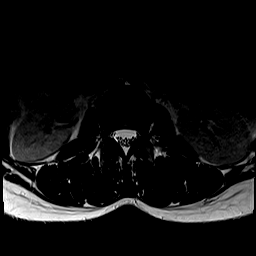
[im 31/36]
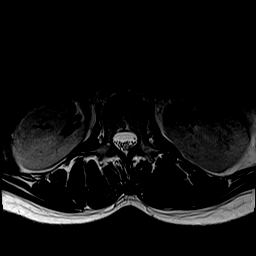
[im 36/36]
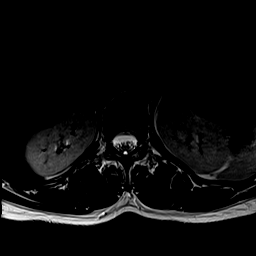

[Series 20: T1 · axial · 4.0mm · 0.39mm/px · z∈[-556,-329]mm · 9 of 36 slices shown (2 of 2)]
[im 1/36]
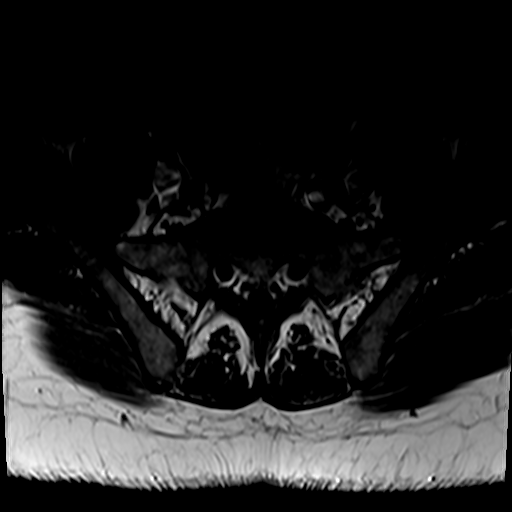
[im 6/36]
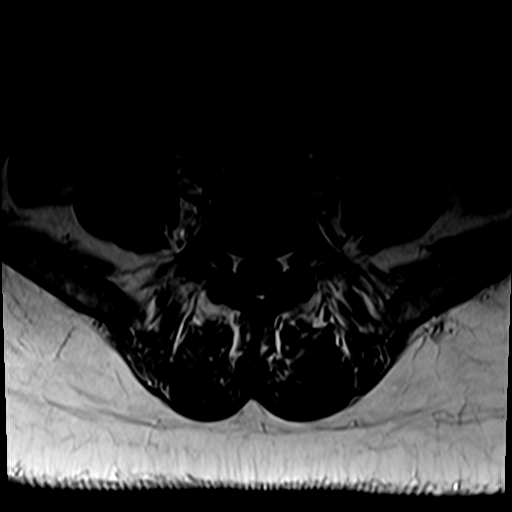
[im 11/36]
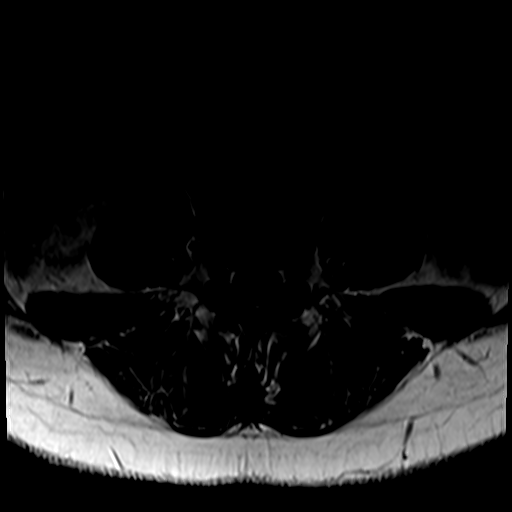
[im 16/36]
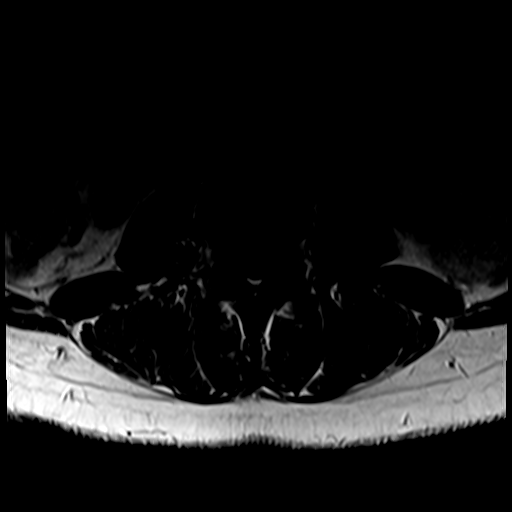
[im 18/36]
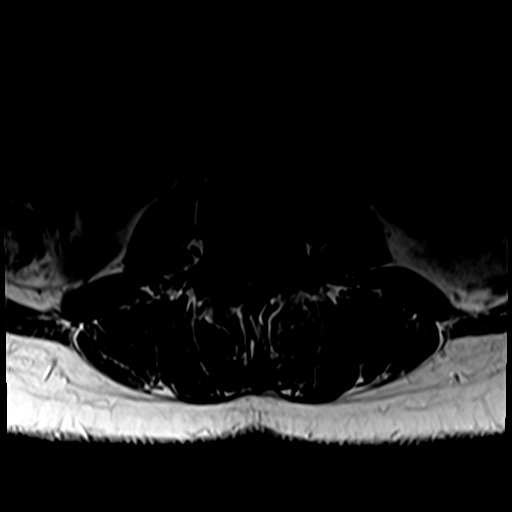
[im 21/36]
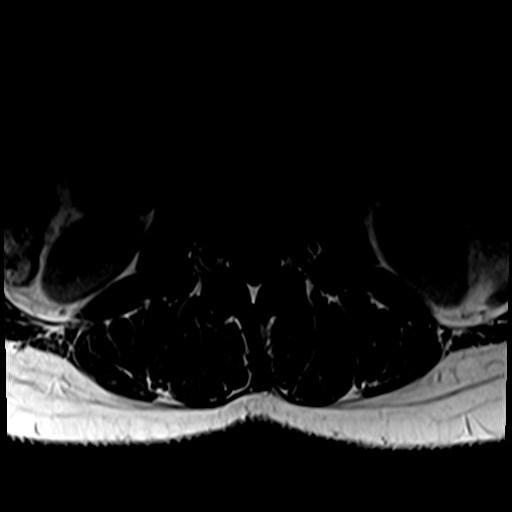
[im 26/36]
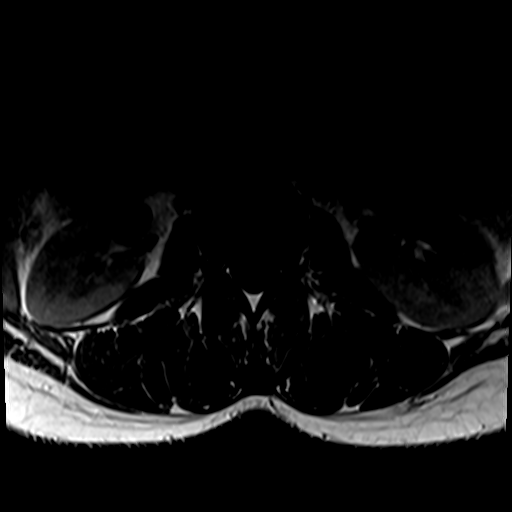
[im 31/36]
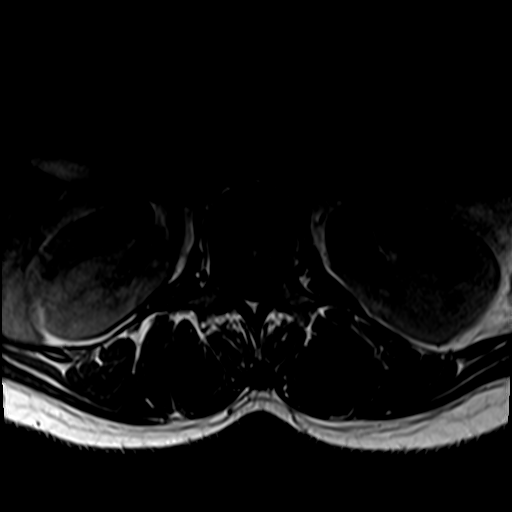
[im 36/36]
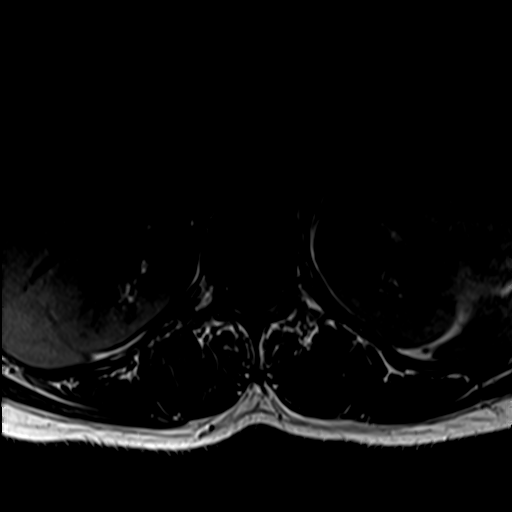

[31 of 48 positions shown; findings below may reference images not displayed]

FINDINGS: Segmentation: Standard. Lowest well-formed disc space labeled the
L5-S1 level.

Alignment: Physiologic with preservation of the normal lumbar
lordosis. No listhesis.

Vertebrae: Vertebral body height well maintained without acute or
chronic fracture. Bone marrow signal intensity within normal limits.
No discrete or worrisome osseous lesions or abnormal marrow edema.

Conus medullaris and cauda equina: Conus extends to the T12-L1
level. Conus and cauda equina appear normal.

Paraspinal and other soft tissues: Unremarkable.

Disc levels:

L1-2: Normal interspace. Mild facet hypertrophy. No canal or
foraminal stenosis.

L2-3: Normal interspace. Mild facet hypertrophy. No canal or
foraminal stenosis.

L3-4: Normal interspace. Mild to moderate facet hypertrophy. No
spinal stenosis. Foramina remain patent. No impingement.

L4-5: Negative interspace. Mild bilateral facet hypertrophy.
Borderline mild narrowing of the lateral recesses bilaterally.
Central canal remains patent. No significant foraminal encroachment.

L5-S1: Negative interspace. Mild facet hypertrophy. No stenosis or
impingement.
IMPRESSION: 1. Mild-to-moderate multilevel facet hypertrophy throughout the
lumbar spine, most pronounced at L3-4.
2. Otherwise essentially normal MRI of the lumbar spine. No
significant disc pathology, stenosis, or evidence for neural
impingement.
